# Patient Record
Sex: Female | Born: 1945 | Race: White | Hispanic: No | Smoking: Never smoker
Health system: Southern US, Community
[De-identification: ages and names within clinical notes are randomized; demographics above are authoritative.]

## PROBLEM LIST (undated history)

## (undated) DIAGNOSIS — F419 Anxiety disorder, unspecified: Secondary | ICD-10-CM

## (undated) DIAGNOSIS — J45909 Unspecified asthma, uncomplicated: Secondary | ICD-10-CM

## (undated) DIAGNOSIS — D649 Anemia, unspecified: Secondary | ICD-10-CM

## (undated) DIAGNOSIS — F329 Major depressive disorder, single episode, unspecified: Secondary | ICD-10-CM

## (undated) DIAGNOSIS — I10 Essential (primary) hypertension: Secondary | ICD-10-CM

## (undated) DIAGNOSIS — N189 Chronic kidney disease, unspecified: Secondary | ICD-10-CM

## (undated) DIAGNOSIS — M1990 Unspecified osteoarthritis, unspecified site: Secondary | ICD-10-CM

## (undated) DIAGNOSIS — F32A Depression, unspecified: Secondary | ICD-10-CM

## (undated) DIAGNOSIS — J189 Pneumonia, unspecified organism: Secondary | ICD-10-CM

## (undated) DIAGNOSIS — J302 Other seasonal allergic rhinitis: Secondary | ICD-10-CM

## (undated) DIAGNOSIS — R0600 Dyspnea, unspecified: Secondary | ICD-10-CM

## (undated) DIAGNOSIS — K219 Gastro-esophageal reflux disease without esophagitis: Secondary | ICD-10-CM

## (undated) DIAGNOSIS — B009 Herpesviral infection, unspecified: Secondary | ICD-10-CM

## (undated) DIAGNOSIS — K5900 Constipation, unspecified: Secondary | ICD-10-CM

## (undated) DIAGNOSIS — I82409 Acute embolism and thrombosis of unspecified deep veins of unspecified lower extremity: Secondary | ICD-10-CM

## (undated) DIAGNOSIS — Z87442 Personal history of urinary calculi: Secondary | ICD-10-CM

## (undated) HISTORY — PX: OTHER SURGICAL HISTORY: SHX169

## (undated) HISTORY — PX: TUBAL LIGATION: SHX77

## (undated) HISTORY — PX: COLONOSCOPY: SHX174

---

## 1995-10-17 DIAGNOSIS — I82409 Acute embolism and thrombosis of unspecified deep veins of unspecified lower extremity: Secondary | ICD-10-CM

## 1995-10-17 HISTORY — DX: Acute embolism and thrombosis of unspecified deep veins of unspecified lower extremity: I82.409

## 1999-05-12 ENCOUNTER — Other Ambulatory Visit: Admission: RE | Admit: 1999-05-12 | Discharge: 1999-05-12 | Payer: Self-pay | Admitting: Internal Medicine

## 2000-09-11 ENCOUNTER — Other Ambulatory Visit: Admission: RE | Admit: 2000-09-11 | Discharge: 2000-09-11 | Payer: Self-pay | Admitting: Internal Medicine

## 2011-11-13 ENCOUNTER — Inpatient Hospital Stay: Admit: 2011-11-13 | Payer: Self-pay | Admitting: Orthopedic Surgery

## 2011-11-13 SURGERY — ARTHROPLASTY, KNEE, TOTAL
Anesthesia: Spinal | Laterality: Right

## 2015-05-10 ENCOUNTER — Other Ambulatory Visit: Payer: Self-pay | Admitting: Internal Medicine

## 2015-05-10 DIAGNOSIS — Z1231 Encounter for screening mammogram for malignant neoplasm of breast: Secondary | ICD-10-CM

## 2015-12-02 DIAGNOSIS — M170 Bilateral primary osteoarthritis of knee: Secondary | ICD-10-CM | POA: Diagnosis not present

## 2015-12-02 DIAGNOSIS — M1712 Unilateral primary osteoarthritis, left knee: Secondary | ICD-10-CM | POA: Diagnosis not present

## 2015-12-02 DIAGNOSIS — M1711 Unilateral primary osteoarthritis, right knee: Secondary | ICD-10-CM | POA: Diagnosis not present

## 2016-01-24 DIAGNOSIS — Z01818 Encounter for other preprocedural examination: Secondary | ICD-10-CM | POA: Diagnosis not present

## 2016-01-24 DIAGNOSIS — N183 Chronic kidney disease, stage 3 (moderate): Secondary | ICD-10-CM | POA: Diagnosis not present

## 2016-01-24 DIAGNOSIS — M1712 Unilateral primary osteoarthritis, left knee: Secondary | ICD-10-CM | POA: Diagnosis not present

## 2016-01-26 NOTE — H&P (Signed)
TOTAL KNEE ADMISSION H&P  Patient is being admitted for left total knee arthroplasty.  Subjective:  Chief Complaint:   Left knee primary OA / pain  HPI: Nicole Stephenson, 70 y.o. female, has a history of pain and functional disability in the left knee due to arthritis and has failed non-surgical conservative treatments for greater than 12 weeks to include  NSAID's and/or analgesics, corticosteriod injections, viscosupplementation injections and activity modification.  Onset of symptoms was gradual, starting >10 years ago with gradually worsening course since that time. The patient noted no past surgery on the left knee(s).  Patient currently rates pain in the left knee(s) at 9 out of 10 with activity. Patient has night pain, worsening of pain with activity and weight bearing, pain that interferes with activities of daily living, pain with passive range of motion, crepitus and joint swelling.  Patient has evidence of periarticular osteophytes and joint space narrowing by imaging studies. There is no active infection.  Risks, benefits and expectations were discussed with the patient.  Risks including but not limited to the risk of anesthesia, blood clots, nerve damage, blood vessel damage, failure of the prosthesis, infection and up to and including death.  Patient understand the risks, benefits and expectations and wishes to proceed with surgery.   PCP: Hoyle SauerAVVA,RAVISANKAR R, MD  D/C Plans:      Home with HHPT  Post-op Meds:       No Rx given   Tranexamic Acid:      To be given - topically (previous DVT)  Decadron:      Is to be given  FYI:     Xarelto then ASA  Norco    Past Medical History  Diagnosis Date  . Hypertension   . Pneumonia   . Depression   . GERD (gastroesophageal reflux disease)   . Arthritis   . Anemia     Past Surgical History  Procedure Laterality Date  . Bottom of left foot for keloids - 1993    . Right foot surgery ,rod inserted in lateral side of foot      No  prescriptions prior to admission   Allergies  Allergen Reactions  . Penicillins     Has patient had a PCN reaction causing immediate rash, facial/tongue/throat swelling, SOB or lightheadedness with hypotension: "Arm swelling, turned red and itched really bad" Has patient had a PCN reaction causing severe rash involving mucus membranes or skin necrosis:  Has patient had a PCN reaction that required hospitalization No Has patient had a PCN reaction occurring within the last 10 years: No If all of the above answers are "NO", then may proceed with Cephalosporin use.     Social History  Substance Use Topics  . Smoking status: Never Smoker   . Smokeless tobacco: Never Used  . Alcohol Use: No       Review of Systems  Constitutional: Negative.   HENT: Negative.   Eyes: Negative.   Respiratory: Negative.   Cardiovascular: Negative.   Gastrointestinal: Positive for heartburn.  Genitourinary: Negative.   Musculoskeletal: Positive for joint pain.  Skin: Negative.   Neurological: Negative.   Endo/Heme/Allergies: Negative.   Psychiatric/Behavioral: Positive for depression.    Objective:  Physical Exam  Constitutional: She is oriented to person, place, and time. She appears well-developed.  HENT:  Head: Normocephalic.  Eyes: Pupils are equal, round, and reactive to light.  Neck: Neck supple. No JVD present. No tracheal deviation present. No thyromegaly present.  Cardiovascular: Normal rate, regular rhythm,  normal heart sounds and intact distal pulses.   Respiratory: Effort normal and breath sounds normal. No stridor. No respiratory distress. She has no wheezes.  GI: Soft. There is no tenderness. There is no guarding.  Musculoskeletal:       Left knee: She exhibits decreased range of motion, swelling and bony tenderness. She exhibits no ecchymosis, no deformity, no laceration and no erythema. Tenderness found.  Lymphadenopathy:    She has no cervical adenopathy.  Neurological: She  is alert and oriented to person, place, and time.  Skin: Skin is warm and dry.  Psychiatric: She has a normal mood and affect.      Imaging Review Plain radiographs demonstrate severe degenerative joint disease of the left knee(s). The overall alignment is neutral. The bone quality appears to be good for age and reported activity level.  Assessment/Plan:  End stage arthritis, left knee   The patient history, physical examination, clinical judgment of the provider and imaging studies are consistent with end stage degenerative joint disease of the left knee(s) and total knee arthroplasty is deemed medically necessary. The treatment options including medical management, injection therapy arthroscopy and arthroplasty were discussed at length. The risks and benefits of total knee arthroplasty were presented and reviewed. The risks due to aseptic loosening, infection, stiffness, patella tracking problems, thromboembolic complications and other imponderables were discussed. The patient acknowledged the explanation, agreed to proceed with the plan and consent was signed. Patient is being admitted for inpatient treatment for surgery, pain control, PT, OT, prophylactic antibiotics, VTE prophylaxis, progressive ambulation and ADL's and discharge planning. The patient is planning to be discharged home with home health services.     Anastasio Auerbach Makelle Marrone   PA-C  02/03/2016, 12:00 PM

## 2016-01-31 NOTE — Patient Instructions (Signed)
Nicole Stephenson  01/31/2016   Your procedure is scheduled on: 02-08-16  [facility]  [facility]  [facility]vators to 3rd floor to  Short Stay Center at 7:00 AM.  Call this number if you have problems the morning of surgery [phone]   Remember: ONLY 1 PERSON MAY GO WITH YOU TO SHORT STAY TO GET  READY MORNING OF YOUR SURGERY.  Do not eat food or drink liquids :After Midnight.     Take these medicines the morning of surgery with A SIP OF WATER:  Amlodipine (norvasc), Wellbutrin, Zrytec, Cymbalta, Prilosec DO NOT TAKE ANY DIABETIC MEDICATIONS DAY OF YOUR SURGERY                               You may not have any metal on your body including hair pins and              piercings  Do not wear jewelry, make-up, lotions, powders or perfumes, deodorant             Do not wear nail polish.  Do not shave  48 hours prior to surgery.            Do not bring valuables to the hospital. [facility] IS NOT             RESPONSIBLE   FOR VALUABLES.  Contacts, dentures or bridgework may not be worn into surgery.  Leave suitcase in the car. After surgery it may be brought to your room.       Special Instructions: coughing and deep breathing exercises, leg exercises              Please read over the following fact sheets you were given: _____________________________________________________________________             [facility] Preparing [facility] Before surgery, you can play an important role.  Because skin is not sterile, your skin needs to be as free of germs as possible.  You can reduce the number of germs on your skin by washing with CHG (chlorahexidine gluconate) soap before surgery.  CHG is an antiseptic cleaner which kills germs and bonds with the skin to continue killing germs even after washing. Please DO NOT use if you have an allergy to CHG or antibacterial soaps.  If your skin becomes reddened/irritated stop using the CHG and inform your  nurse when you arrive at Short Stay. Do not shave (including legs and underarms) for at least 48 hours prior to the first CHG shower.  You may shave your face/neck. Please follow these instructions carefully:  1.  Shower with CHG Soap the night before surgery and the  morning of Surgery.  2.  If you choose to wash your hair, wash your hair first as usual with your  normal  shampoo.  3.  After you shampoo, rinse your hair and body thoroughly to remove the  shampoo.                           4.  Use CHG as you would any other liquid soap.  You can apply chg directly  to the skin and wash  Gently with a scrungie or clean washcloth.  5.  Apply the CHG Soap to your body ONLY FROM THE NECK DOWN.   Do not use on face/ open                           Wound or open sores. Avoid contact with eyes, ears mouth and genitals (private parts).                       Wash face,  Genitals (private parts) with your normal soap.             6.  Wash thoroughly, paying special attention to the area where your surgery  will be performed.  7.  Thoroughly rinse your body with warm water from the neck down.  8.  DO NOT shower/wash with your normal soap after using and rinsing off  the CHG Soap.                9.  Pat yourself dry with a clean towel.            10.  Wear clean pajamas.            11.  Place clean sheets on your bed the night of your first shower and do not  sleep with pets. Day of Surgery : Do not apply any lotions/deodorants the morning of surgery.  Please wear clean clothes to the hospital/surgery center.  FAILURE TO FOLLOW THESE INSTRUCTIONS MAY RESULT IN THE CANCELLATION OF YOUR SURGERY PATIENT SIGNATURE_________________________________  NURSE SIGNATURE__________________________________  ________________________________________________________________________   Nicole Stephenson  An incentive spirometer is a tool that can help keep your lungs clear and active. This tool  measures how well you are filling your lungs with each breath. Taking long deep breaths may help reverse or decrease the chance of developing breathing (pulmonary) problems (especially infection) following:  A long period of time when you are unable to move or be active. BEFORE THE PROCEDURE   If the spirometer includes an indicator to show your best effort, your nurse or respiratory therapist will set it to a desired goal.  If possible, sit up straight or lean slightly forward. Try not to slouch.  Hold the incentive spirometer in an upright position. INSTRUCTIONS FOR USE   Sit on the edge of your bed if possible, or sit up as far as you can in bed or on a chair.  Hold the incentive spirometer in an upright position.  Breathe out normally.  Place the mouthpiece in your mouth and seal your lips tightly around it.  Breathe in slowly and as deeply as possible, raising the piston or the ball toward the top of the column.  Hold your breath for 3-5 seconds or for as long as possible. Allow the piston or ball to fall to the bottom of the column.  Remove the mouthpiece from your mouth and breathe out normally.  Rest for a few seconds and repeat Steps 1 through 7 at least 10 times every 1-2 hours when you are awake. Take your time and take a few normal breaths between deep breaths.  The spirometer may include an indicator to show your best effort. Use the indicator as a goal to work toward during each repetition.  After each set of 10 deep breaths, practice coughing to be sure your lungs are clear. If you have an incision (the cut made at the time of surgery),  support your incision when coughing by placing a pillow or rolled up towels firmly against it. Once you are able to get out of bed, walk around indoors and cough well. You may stop using the incentive spirometer when instructed by your caregiver.  RISKS AND COMPLICATIONS  Take your time so you do not get dizzy or light-headed.  If  you are in pain, you may need to take or ask for pain medication before doing incentive spirometry. It is harder to take a deep breath if you are having pain. AFTER USE  Rest and breathe slowly and easily.  It can be helpful to keep track of a log of your progress. Your caregiver can provide you with a simple table to help with this. If you are using the spirometer at home, follow these instructions: [place] IF:   You are having difficultly using the spirometer.  You have trouble using the spirometer as often as instructed.  Your pain medication is not giving enough relief while using the spirometer.  You develop fever of 100.5 F (38.1 C) or higher. SEEK IMMEDIATE MEDICAL CARE IF:   You cough up bloody sputum that had not been present before.  You develop fever of 102 F (38.9 C) or greater.  You develop worsening pain at or near the incision site. MAKE SURE YOU:   Understand these instructions.  Will watch your condition.  Will get help right away if you are not doing well or get worse. Document Released: 02/12/2007 Document Revised: 12/25/2011 Document Reviewed: 04/15/2007 Exit[employer] Patient Information 2014 ExitCare, [place].   ________________________________________________________________________  WHAT IS A BLOOD TRANSFUSION? Blood Transfusion Information  A transfusion is the replacement of blood or some of its parts. Blood is made up of multiple cells which provide different functions.  Red blood cells carry oxygen and are used for blood loss replacement.  White blood cells fight against infection.  Platelets control bleeding.  Plasma helps clot blood.  Other blood products are available for specialized needs, such as hemophilia or other clotting disorders. BEFORE THE TRANSFUSION  Who gives blood for transfusions?   Healthy volunteers who are fully evaluated to make sure their blood is safe. This is blood bank blood. Transfusion therapy is the  safest it has ever been in the practice of medicine. Before blood is taken from a donor, a complete history is taken to make sure that person has no history of diseases nor engages in risky social behavior (examples are intravenous drug use or sexual activity with multiple partners). The donor's travel history is screened to minimize risk of transmitting infections, such as malaria. The donated blood is tested for signs of infectious diseases, such as HIV and hepatitis. The blood is then tested to be sure it is compatible with you in order to minimize the chance of a transfusion reaction. If you or a relative donates blood, this is often done in anticipation of surgery and is not appropriate for emergency situations. It takes many days to process the donated blood. RISKS AND COMPLICATIONS Although transfusion therapy is very safe and saves many lives, the main dangers of transfusion include:   Getting an infectious disease.  Developing a transfusion reaction. This is an allergic reaction to something in the blood you were given. Every precaution is taken to prevent this. The decision to have a blood transfusion has been considered carefully by your caregiver before blood is given. Blood is not given unless the benefits outweigh the risks. AFTER THE TRANSFUSION  Right after receiving a blood transfusion, you will usually feel much better and more energetic. This is especially true if your red blood cells have gotten low (anemic). The transfusion raises the level of the red blood cells which carry oxygen, and this usually causes an energy increase.  The nurse administering the transfusion will monitor you carefully for complications. HOME CARE INSTRUCTIONS  No special instructions are needed after a transfusion. You may find your energy is better. Speak with your caregiver about any limitations on activity for underlying diseases you may have. SEEK MEDICAL CARE IF:   Your condition is not improving  after your transfusion.  You develop redness or irritation at the intravenous (IV) site. SEEK IMMEDIATE MEDICAL CARE IF:  Any of the following symptoms occur over the next 12 hours:  Shaking chills.  You have a temperature by mouth above 102 F (38.9 C), not controlled by medicine.  Chest, back, or muscle pain.  People around you feel you are not acting correctly or are confused.  Shortness of breath or difficulty breathing.  Dizziness and fainting.  You get a rash or develop hives.  You have a decrease in urine output.  Your urine turns a dark color or changes to pink, red, or brown. Any of the following symptoms occur over the next 10 days:  You have a temperature by mouth above 102 F (38.9 C), not controlled by medicine.  Shortness of breath.  Weakness after normal activity.  The white part of the eye turns yellow (jaundice).  You have a decrease in the amount of urine or are urinating less often.  Your urine turns a dark color or changes to pink, red, or brown. Document Released: 09/29/2000 Document Revised: 12/25/2011 Document Reviewed: 05/18/2008 [facility] Patient Information 2014 [place], [place].  _______________________________________________________________________

## 2016-02-01 ENCOUNTER — Encounter (HOSPITAL_COMMUNITY)
Admission: RE | Admit: 2016-02-01 | Discharge: 2016-02-01 | Disposition: A | Discharge status: Home / Self Care | Payer: Medicare Other | Source: Ambulatory Visit | Attending: Orthopedic Surgery | Admitting: Orthopedic Surgery

## 2016-02-01 ENCOUNTER — Encounter (HOSPITAL_COMMUNITY): Payer: Self-pay

## 2016-02-01 DIAGNOSIS — Z01812 Encounter for preprocedural laboratory examination: Secondary | ICD-10-CM | POA: Insufficient documentation

## 2016-02-01 HISTORY — DX: Pneumonia, unspecified organism: J18.9

## 2016-02-01 HISTORY — DX: Depression, unspecified: F32.A

## 2016-02-01 HISTORY — DX: Essential (primary) hypertension: I10

## 2016-02-01 HISTORY — DX: Anemia, unspecified: D64.9

## 2016-02-01 HISTORY — DX: Major depressive disorder, single episode, unspecified: F32.9

## 2016-02-01 HISTORY — DX: Unspecified osteoarthritis, unspecified site: M19.90

## 2016-02-01 HISTORY — DX: Gastro-esophageal reflux disease without esophagitis: K21.9

## 2016-02-01 LAB — BASIC METABOLIC PANEL
ANION GAP: 10 (ref 5–15)
BUN: 16 mg/dL (ref 6–20)
CHLORIDE: 100 mmol/L — AB (ref 101–111)
CO2: 29 mmol/L (ref 22–32)
Calcium: 10 mg/dL (ref 8.9–10.3)
Creatinine, Ser: 1.39 mg/dL — ABNORMAL HIGH (ref 0.44–1.00)
GFR calc Af Amer: 44 mL/min — ABNORMAL LOW (ref >60)
GFR calc non Af Amer: 38 mL/min — ABNORMAL LOW (ref >60)
GLUCOSE: 104 mg/dL — AB (ref 65–99)
POTASSIUM: 4.6 mmol/L (ref 3.5–5.1)
Sodium: 139 mmol/L (ref 135–145)

## 2016-02-01 LAB — URINALYSIS, ROUTINE W REFLEX MICROSCOPIC
Bilirubin Urine: NEGATIVE
GLUCOSE, UA: NEGATIVE mg/dL
Hgb urine dipstick: NEGATIVE
KETONES UR: NEGATIVE mg/dL
NITRITE: NEGATIVE
PROTEIN: NEGATIVE mg/dL
Specific Gravity, Urine: 1.013 (ref 1.005–1.030)
pH: 6.5 (ref 5.0–8.0)

## 2016-02-01 LAB — CBC
HEMATOCRIT: 35.9 % — AB (ref 36.0–46.0)
HEMOGLOBIN: 12.2 g/dL (ref 12.0–15.0)
MCH: 31.3 pg (ref 26.0–34.0)
MCHC: 34 g/dL (ref 30.0–36.0)
MCV: 92.1 fL (ref 78.0–100.0)
Platelets: 264 10*3/uL (ref 150–400)
RBC: 3.9 MIL/uL (ref 3.87–5.11)
RDW: 13.3 % (ref 11.5–15.5)
WBC: 5.8 10*3/uL (ref 4.0–10.5)

## 2016-02-01 LAB — PROTIME-INR
INR: 1.07 (ref 0.00–1.49)
Prothrombin Time: 13.6 seconds (ref 11.6–15.2)

## 2016-02-01 LAB — ABO/RH: ABO/RH(D): A POS

## 2016-02-01 LAB — URINE MICROSCOPIC-ADD ON: Bacteria, UA: NONE SEEN

## 2016-02-01 LAB — APTT: aPTT: 23 seconds — ABNORMAL LOW (ref 24–37)

## 2016-02-01 LAB — SURGICAL PCR SCREEN
MRSA, PCR: NEGATIVE
Staphylococcus aureus: POSITIVE — AB

## 2016-02-01 NOTE — Progress Notes (Signed)
02-01-16 - PT, UA/MICRO labs from preop visit on 02-01-16 fax[facility]

## 2016-02-01 NOTE — Progress Notes (Signed)
04-30-15 - EKG - in chart

## 2016-02-08 ENCOUNTER — Inpatient Hospital Stay (HOSPITAL_COMMUNITY): Payer: Medicare Other | Admitting: Anesthesiology

## 2016-02-08 ENCOUNTER — Inpatient Hospital Stay (HOSPITAL_COMMUNITY)
Admission: RE | Admit: 2016-02-08 | Discharge: 2016-02-09 | DRG: 470 | Disposition: A | Discharge status: Home Health | Payer: Medicare Other | Source: Ambulatory Visit | Attending: Orthopedic Surgery | Admitting: Orthopedic Surgery

## 2016-02-08 ENCOUNTER — Encounter (HOSPITAL_COMMUNITY): Payer: Self-pay

## 2016-02-08 ENCOUNTER — Encounter (HOSPITAL_COMMUNITY)
Admission: RE | Disposition: A | Discharge status: Home Health | Payer: Self-pay | Source: Ambulatory Visit | Attending: Orthopedic Surgery

## 2016-02-08 DIAGNOSIS — Z6830 Body mass index (BMI) 30.0-30.9, adult: Secondary | ICD-10-CM | POA: Diagnosis not present

## 2016-02-08 DIAGNOSIS — M179 Osteoarthritis of knee, unspecified: Secondary | ICD-10-CM | POA: Diagnosis not present

## 2016-02-08 DIAGNOSIS — Z96659 Presence of unspecified artificial knee joint: Secondary | ICD-10-CM

## 2016-02-08 DIAGNOSIS — I10 Essential (primary) hypertension: Secondary | ICD-10-CM | POA: Diagnosis present

## 2016-02-08 DIAGNOSIS — M1712 Unilateral primary osteoarthritis, left knee: Principal | ICD-10-CM | POA: Diagnosis present

## 2016-02-08 DIAGNOSIS — E669 Obesity, unspecified: Secondary | ICD-10-CM | POA: Diagnosis not present

## 2016-02-08 DIAGNOSIS — M25562 Pain in left knee: Secondary | ICD-10-CM | POA: Diagnosis present

## 2016-02-08 DIAGNOSIS — Z86718 Personal history of other venous thrombosis and embolism: Secondary | ICD-10-CM | POA: Diagnosis not present

## 2016-02-08 DIAGNOSIS — M659 Synovitis and tenosynovitis, unspecified: Secondary | ICD-10-CM | POA: Diagnosis present

## 2016-02-08 DIAGNOSIS — K219 Gastro-esophageal reflux disease without esophagitis: Secondary | ICD-10-CM | POA: Diagnosis not present

## 2016-02-08 DIAGNOSIS — Z96652 Presence of left artificial knee joint: Secondary | ICD-10-CM

## 2016-02-08 HISTORY — PX: TOTAL KNEE ARTHROPLASTY: SHX125

## 2016-02-08 LAB — TYPE AND SCREEN
ABO/RH(D): A POS
Antibody Screen: NEGATIVE

## 2016-02-08 SURGERY — ARTHROPLASTY, KNEE, TOTAL
Anesthesia: Spinal | Site: Knee | Laterality: Left

## 2016-02-08 MED ORDER — RIVAROXABAN 10 MG PO TABS
10.0000 mg | ORAL_TABLET | ORAL | Status: DC
Start: 2016-02-09 — End: 2016-02-09
  Administered 2016-02-09: 10 mg via ORAL
  Filled 2016-02-08 (×2): qty 1

## 2016-02-08 MED ORDER — DOCUSATE SODIUM 100 MG PO CAPS
100.0000 mg | ORAL_CAPSULE | Freq: Two times a day (BID) | ORAL | Status: DC
  Administered 2016-02-08 – 2016-02-09 (×2): 100 mg via ORAL

## 2016-02-08 MED ORDER — 0.9 % SODIUM CHLORIDE (POUR BTL) OPTIME
TOPICAL | Status: DC | PRN
  Administered 2016-02-08: 1000 mL

## 2016-02-08 MED ORDER — BUPIVACAINE-EPINEPHRINE (PF) 0.25% -1:200000 IJ SOLN
INTRAMUSCULAR | Status: AC
  Filled 2016-02-08: qty 30

## 2016-02-08 MED ORDER — DEXAMETHASONE SODIUM PHOSPHATE 10 MG/ML IJ SOLN
10.0000 mg | Freq: Once | INTRAMUSCULAR | Status: AC
  Administered 2016-02-08: 10 mg via INTRAVENOUS

## 2016-02-08 MED ORDER — ONDANSETRON HCL 4 MG/2ML IJ SOLN: 4.0000 mg | Freq: Four times a day (QID) | INTRAMUSCULAR | Status: DC | PRN

## 2016-02-08 MED ORDER — BISACODYL 10 MG RE SUPP
10.0000 mg | Freq: Every day | RECTAL | Status: DC | PRN
Start: 2016-02-08 — End: 2016-02-09

## 2016-02-08 MED ORDER — HYDROMORPHONE HCL 1 MG/ML IJ SOLN
0.5000 mg | INTRAMUSCULAR | Status: DC | PRN
  Administered 2016-02-08: 0.5 mg via INTRAVENOUS
  Filled 2016-02-08: qty 1

## 2016-02-08 MED ORDER — BUPIVACAINE-EPINEPHRINE (PF) 0.25% -1:200000 IJ SOLN
INTRAMUSCULAR | Status: DC | PRN
  Administered 2016-02-08: 30 mL

## 2016-02-08 MED ORDER — OMEPRAZOLE MAGNESIUM 20 MG PO TBEC
40.0000 mg | DELAYED_RELEASE_TABLET | Freq: Every day | ORAL | Status: DC
  Administered 2016-02-09: 40 mg via ORAL
  Filled 2016-02-08 (×2): qty 2

## 2016-02-08 MED ORDER — BUPIVACAINE IN DEXTROSE 0.75-8.25 % IT SOLN
INTRATHECAL | Status: DC | PRN
  Administered 2016-02-08: 1.8 mL via INTRATHECAL

## 2016-02-08 MED ORDER — BUPROPION HCL ER (SR) 150 MG PO TB12
150.0000 mg | ORAL_TABLET | Freq: Every morning | ORAL | Status: DC
  Administered 2016-02-09: 150 mg via ORAL
  Filled 2016-02-08: qty 1

## 2016-02-08 MED ORDER — MIDAZOLAM HCL 5 MG/5ML IJ SOLN
INTRAMUSCULAR | Status: DC | PRN
  Administered 2016-02-08: 2 mg via INTRAVENOUS

## 2016-02-08 MED ORDER — SODIUM CHLORIDE 0.9 % IV SOLN
2000.0000 mg | Freq: Once | INTRAVENOUS | Status: DC
  Filled 2016-02-08: qty 20

## 2016-02-08 MED ORDER — LACTATED RINGERS IV SOLN: INTRAVENOUS | Status: DC

## 2016-02-08 MED ORDER — VANCOMYCIN HCL IN DEXTROSE 1-5 GM/200ML-% IV SOLN
1000.0000 mg | Freq: Two times a day (BID) | INTRAVENOUS | Status: AC
  Administered 2016-02-08: 1000 mg via INTRAVENOUS
  Filled 2016-02-08: qty 200

## 2016-02-08 MED ORDER — KETOROLAC TROMETHAMINE 30 MG/ML IJ SOLN
INTRAMUSCULAR | Status: DC | PRN
  Administered 2016-02-08: 30 mg

## 2016-02-08 MED ORDER — STERILE WATER FOR IRRIGATION IR SOLN
Status: DC | PRN
  Administered 2016-02-08: 2000 mL

## 2016-02-08 MED ORDER — HYDROCODONE-ACETAMINOPHEN 7.5-325 MG PO TABS
1.0000 | ORAL_TABLET | ORAL | Status: DC
  Administered 2016-02-08 – 2016-02-09 (×6): 2 via ORAL
  Administered 2016-02-09: 1 via ORAL
  Filled 2016-02-08 (×7): qty 2

## 2016-02-08 MED ORDER — MENTHOL 3 MG MT LOZG: 1.0000 | LOZENGE | OROMUCOSAL | Status: DC | PRN

## 2016-02-08 MED ORDER — MEPERIDINE HCL 50 MG/ML IJ SOLN: 6.2500 mg | INTRAMUSCULAR | Status: DC | PRN

## 2016-02-08 MED ORDER — DULOXETINE HCL 30 MG PO CPEP
30.0000 mg | ORAL_CAPSULE | Freq: Every day | ORAL | Status: DC
  Administered 2016-02-09: 30 mg via ORAL
  Filled 2016-02-08: qty 1

## 2016-02-08 MED ORDER — FERROUS SULFATE 325 (65 FE) MG PO TABS
325.0000 mg | ORAL_TABLET | Freq: Three times a day (TID) | ORAL | Status: DC
  Administered 2016-02-08 – 2016-02-09 (×3): 325 mg via ORAL
  Filled 2016-02-08 (×5): qty 1

## 2016-02-08 MED ORDER — DEXTROSE 5 % IV SOLN
10.0000 mg | INTRAVENOUS | Status: DC | PRN
  Administered 2016-02-08: 50 ug/min via INTRAVENOUS

## 2016-02-08 MED ORDER — SODIUM CHLORIDE 0.9 % IJ SOLN
INTRAMUSCULAR | Status: DC | PRN
  Administered 2016-02-08: 30 mL

## 2016-02-08 MED ORDER — POLYETHYLENE GLYCOL 3350 17 G PO PACK
17.0000 g | PACK | Freq: Two times a day (BID) | ORAL | Status: DC
  Administered 2016-02-08 – 2016-02-09 (×2): 17 g via ORAL

## 2016-02-08 MED ORDER — ONDANSETRON HCL 4 MG/2ML IJ SOLN
INTRAMUSCULAR | Status: AC
  Filled 2016-02-08: qty 2

## 2016-02-08 MED ORDER — SODIUM CHLORIDE 0.9 % IR SOLN
Status: DC | PRN
  Administered 2016-02-08: 1000 mL

## 2016-02-08 MED ORDER — PROPOFOL 500 MG/50ML IV EMUL
INTRAVENOUS | Status: DC | PRN
Start: 2016-02-08 — End: 2016-02-08
  Administered 2016-02-08: 100 ug/kg/min via INTRAVENOUS

## 2016-02-08 MED ORDER — PHENYLEPHRINE HCL 10 MG/ML IJ SOLN
INTRAMUSCULAR | Status: AC
  Filled 2016-02-08: qty 1

## 2016-02-08 MED ORDER — ONDANSETRON HCL 4 MG PO TABS: 4.0000 mg | ORAL_TABLET | Freq: Four times a day (QID) | ORAL | Status: DC | PRN

## 2016-02-08 MED ORDER — DEXAMETHASONE SODIUM PHOSPHATE 10 MG/ML IJ SOLN
INTRAMUSCULAR | Status: AC
  Filled 2016-02-08: qty 1

## 2016-02-08 MED ORDER — CELECOXIB 200 MG PO CAPS
200.0000 mg | ORAL_CAPSULE | Freq: Two times a day (BID) | ORAL | Status: DC
  Administered 2016-02-09: 200 mg via ORAL
  Filled 2016-02-08 (×3): qty 1

## 2016-02-08 MED ORDER — DEXAMETHASONE SODIUM PHOSPHATE 10 MG/ML IJ SOLN
10.0000 mg | Freq: Once | INTRAMUSCULAR | Status: AC
  Administered 2016-02-09: 10 mg via INTRAVENOUS
  Filled 2016-02-08: qty 1

## 2016-02-08 MED ORDER — SODIUM CHLORIDE 0.9 % IV SOLN
INTRAVENOUS | Status: DC
  Administered 2016-02-08: 14:00:00 via INTRAVENOUS

## 2016-02-08 MED ORDER — AMLODIPINE BESYLATE 10 MG PO TABS
10.0000 mg | ORAL_TABLET | Freq: Every day | ORAL | Status: DC
  Administered 2016-02-09: 10 mg via ORAL
  Filled 2016-02-08: qty 1

## 2016-02-08 MED ORDER — ALUM & MAG HYDROXIDE-SIMETH 200-200-20 MG/5ML PO SUSP: 30.0000 mL | ORAL | Status: DC | PRN

## 2016-02-08 MED ORDER — FENTANYL CITRATE (PF) 100 MCG/2ML IJ SOLN
INTRAMUSCULAR | Status: AC
  Filled 2016-02-08: qty 2

## 2016-02-08 MED ORDER — METOCLOPRAMIDE HCL 5 MG/ML IJ SOLN
5.0000 mg | Freq: Three times a day (TID) | INTRAMUSCULAR | Status: DC | PRN
Start: 2016-02-08 — End: 2016-02-09

## 2016-02-08 MED ORDER — PHENOL 1.4 % MT LIQD: 1.0000 | OROMUCOSAL | Status: DC | PRN

## 2016-02-08 MED ORDER — FENTANYL CITRATE (PF) 100 MCG/2ML IJ SOLN
INTRAMUSCULAR | Status: DC | PRN
  Administered 2016-02-08: 100 ug via INTRAVENOUS

## 2016-02-08 MED ORDER — PROPOFOL 10 MG/ML IV BOLUS
INTRAVENOUS | Status: AC
  Filled 2016-02-08: qty 20

## 2016-02-08 MED ORDER — SODIUM CHLORIDE 0.9 % IJ SOLN
INTRAMUSCULAR | Status: AC
  Filled 2016-02-08: qty 50

## 2016-02-08 MED ORDER — CHLORHEXIDINE GLUCONATE 4 % EX LIQD: 60.0000 mL | Freq: Once | CUTANEOUS | Status: DC

## 2016-02-08 MED ORDER — SODIUM CHLORIDE 0.9 % IV SOLN
2000.0000 mg | INTRAVENOUS | Status: DC | PRN
  Administered 2016-02-08: 2000 mg via TOPICAL

## 2016-02-08 MED ORDER — HYDROCHLOROTHIAZIDE 25 MG PO TABS
25.0000 mg | ORAL_TABLET | Freq: Every morning | ORAL | Status: DC
  Administered 2016-02-08 – 2016-02-09 (×2): 25 mg via ORAL
  Filled 2016-02-08 (×2): qty 1

## 2016-02-08 MED ORDER — CEFAZOLIN SODIUM 1 G IJ SOLR: 2.0000 g | INTRAMUSCULAR | Status: DC

## 2016-02-08 MED ORDER — METOCLOPRAMIDE HCL 10 MG PO TABS: 5.0000 mg | ORAL_TABLET | Freq: Three times a day (TID) | ORAL | Status: DC | PRN

## 2016-02-08 MED ORDER — ONDANSETRON HCL 4 MG/2ML IJ SOLN
INTRAMUSCULAR | Status: DC | PRN
  Administered 2016-02-08: 4 mg via INTRAVENOUS

## 2016-02-08 MED ORDER — METHOCARBAMOL 1000 MG/10ML IJ SOLN
500.0000 mg | Freq: Four times a day (QID) | INTRAVENOUS | Status: DC | PRN
  Administered 2016-02-08: 500 mg via INTRAVENOUS
  Filled 2016-02-08 (×2): qty 5

## 2016-02-08 MED ORDER — PROPOFOL 10 MG/ML IV BOLUS
INTRAVENOUS | Status: AC
  Filled 2016-02-08: qty 60

## 2016-02-08 MED ORDER — KETOROLAC TROMETHAMINE 30 MG/ML IJ SOLN
INTRAMUSCULAR | Status: AC
  Filled 2016-02-08: qty 1

## 2016-02-08 MED ORDER — LORATADINE 10 MG PO TABS
10.0000 mg | ORAL_TABLET | Freq: Every day | ORAL | Status: DC
  Administered 2016-02-09: 10 mg via ORAL
  Filled 2016-02-08: qty 1

## 2016-02-08 MED ORDER — MAGNESIUM CITRATE PO SOLN: 1.0000 | Freq: Once | ORAL | Status: DC | PRN

## 2016-02-08 MED ORDER — HYDROMORPHONE HCL 1 MG/ML IJ SOLN: 0.2500 mg | INTRAMUSCULAR | Status: DC | PRN

## 2016-02-08 MED ORDER — MIDAZOLAM HCL 2 MG/2ML IJ SOLN
INTRAMUSCULAR | Status: AC
  Filled 2016-02-08: qty 2

## 2016-02-08 MED ORDER — LACTATED RINGERS IV SOLN
INTRAVENOUS | Status: DC
  Administered 2016-02-08: 1000 mL via INTRAVENOUS

## 2016-02-08 MED ORDER — DIPHENHYDRAMINE HCL 25 MG PO CAPS: 25.0000 mg | ORAL_CAPSULE | Freq: Four times a day (QID) | ORAL | Status: DC | PRN

## 2016-02-08 MED ORDER — VANCOMYCIN HCL IN DEXTROSE 1-5 GM/200ML-% IV SOLN
1000.0000 mg | Freq: Once | INTRAVENOUS | Status: AC
  Administered 2016-02-08: 1000 mg via INTRAVENOUS
  Filled 2016-02-08: qty 200

## 2016-02-08 MED ORDER — PRAVASTATIN SODIUM 40 MG PO TABS
40.0000 mg | ORAL_TABLET | Freq: Every morning | ORAL | Status: DC
  Administered 2016-02-08 – 2016-02-09 (×2): 40 mg via ORAL
  Filled 2016-02-08 (×2): qty 1

## 2016-02-08 MED ORDER — METHOCARBAMOL 500 MG PO TABS
500.0000 mg | ORAL_TABLET | Freq: Four times a day (QID) | ORAL | Status: DC | PRN
  Administered 2016-02-09: 500 mg via ORAL
  Filled 2016-02-08: qty 1

## 2016-02-08 MED ORDER — TRAZODONE HCL 50 MG PO TABS: 50.0000 mg | ORAL_TABLET | Freq: Every evening | ORAL | Status: DC | PRN

## 2016-02-08 MED ORDER — LACTATED RINGERS IV SOLN
INTRAVENOUS | Status: DC
  Administered 2016-02-08 (×3): via INTRAVENOUS

## 2016-02-08 SURGICAL SUPPLY — 52 items
BAG DECANTER FOR FLEXI CONT (MISCELLANEOUS) IMPLANT
BAG ZIPLOCK 12X15 (MISCELLANEOUS) IMPLANT
BANDAGE ACE 6X5 VEL STRL LF (GAUZE/BANDAGES/DRESSINGS) ×2 IMPLANT
BLADE SAW SGTL 13.0X1.19X90.0M (BLADE) ×2 IMPLANT
BOWL SMART MIX CTS (DISPOSABLE) ×2 IMPLANT
CAPT KNEE TOTAL 3 ATTUNE ×2 IMPLANT
CEMENT HV SMART SET (Cement) ×4 IMPLANT
CLOTH BEACON ORANGE TIMEOUT ST (SAFETY) ×2 IMPLANT
CUFF TOURN SGL QUICK 34 (TOURNIQUET CUFF) ×1
CUFF TRNQT CYL 34X4X40X1 (TOURNIQUET CUFF) ×1 IMPLANT
DECANTER SPIKE VIAL GLASS SM (MISCELLANEOUS) ×2 IMPLANT
DRAPE U-SHAPE 47X51 STRL (DRAPES) ×2 IMPLANT
DRESSING AQUACEL AG SP 3.5X10 (GAUZE/BANDAGES/DRESSINGS) IMPLANT
DRSG AQUACEL AG ADV 3.5X10 (GAUZE/BANDAGES/DRESSINGS) ×2 IMPLANT
DRSG AQUACEL AG SP 3.5X10 (GAUZE/BANDAGES/DRESSINGS)
DURAPREP 26ML APPLICATOR (WOUND CARE) ×4 IMPLANT
ELECT REM PT RETURN 9FT ADLT (ELECTROSURGICAL) ×2
ELECTRODE REM PT RTRN 9FT ADLT (ELECTROSURGICAL) ×1 IMPLANT
GLOVE BIO SURGEON STRL SZ7 (GLOVE) ×2 IMPLANT
GLOVE BIOGEL PI IND STRL 7.0 (GLOVE) ×1 IMPLANT
GLOVE BIOGEL PI IND STRL 7.5 (GLOVE) ×3 IMPLANT
GLOVE BIOGEL PI IND STRL 8 (GLOVE) ×1 IMPLANT
GLOVE BIOGEL PI IND STRL 8.5 (GLOVE) ×1 IMPLANT
GLOVE BIOGEL PI INDICATOR 7.0 (GLOVE) ×1
GLOVE BIOGEL PI INDICATOR 7.5 (GLOVE) ×3
GLOVE BIOGEL PI INDICATOR 8 (GLOVE) ×1
GLOVE BIOGEL PI INDICATOR 8.5 (GLOVE) ×1
GLOVE ECLIPSE 8.0 STRL XLNG CF (GLOVE) ×2 IMPLANT
GLOVE ORTHO TXT STRL SZ7.5 (GLOVE) ×4 IMPLANT
GLOVE SURG SS PI 7.5 STRL IVOR (GLOVE) ×2 IMPLANT
GLOVE SURG SS PI 8.0 STRL IVOR (GLOVE) ×2 IMPLANT
GOWN STRL REUS W/ TWL LRG LVL3 (GOWN DISPOSABLE) ×1 IMPLANT
GOWN STRL REUS W/TWL LRG LVL3 (GOWN DISPOSABLE) ×5 IMPLANT
GOWN STRL REUS W/TWL XL LVL3 (GOWN DISPOSABLE) ×4 IMPLANT
HANDPIECE INTERPULSE COAX TIP (DISPOSABLE) ×1
LIQUID BAND (GAUZE/BANDAGES/DRESSINGS) ×2 IMPLANT
MANIFOLD NEPTUNE II (INSTRUMENTS) ×2 IMPLANT
PACK TOTAL KNEE CUSTOM (KITS) ×2 IMPLANT
POSITIONER SURGICAL ARM (MISCELLANEOUS) ×2 IMPLANT
SET HNDPC FAN SPRY TIP SCT (DISPOSABLE) ×1 IMPLANT
SET PAD KNEE POSITIONER (MISCELLANEOUS) ×2 IMPLANT
SUCTION FRAZIER HANDLE 12FR (TUBING) ×1
SUCTION TUBE FRAZIER 12FR DISP (TUBING) ×1 IMPLANT
SUT MNCRL AB 4-0 PS2 18 (SUTURE) ×2 IMPLANT
SUT VIC AB 1 CT1 36 (SUTURE) ×6 IMPLANT
SUT VIC AB 2-0 CT1 27 (SUTURE) ×3
SUT VIC AB 2-0 CT1 TAPERPNT 27 (SUTURE) ×3 IMPLANT
SUT VLOC 180 0 24IN GS25 (SUTURE) ×2 IMPLANT
SYR 50ML LL SCALE MARK (SYRINGE) ×2 IMPLANT
TRAY FOLEY W/METER SILVER 14FR (SET/KITS/TRAYS/PACK) ×2 IMPLANT
WRAP KNEE MAXI GEL POST OP (GAUZE/BANDAGES/DRESSINGS) ×2 IMPLANT
YANKAUER SUCT BULB TIP 10FT TU (MISCELLANEOUS) ×2 IMPLANT

## 2016-02-08 NOTE — Interval H&P Note (Signed)
History and Physical Interval Note:  02/08/2016 9:01 AM  Nicole Stephenson  has presented today for surgery, with the diagnosis of LEFT KNEE OSTEOARTHRITIS  The various methods of treatment have been discussed with the patient and family. After consideration of risks, benefits and other options for treatment, the patient has consented to  Procedure(s): LEFT TOTAL KNEE ARTHROPLASTY (Left) as a surgical intervention .  The patient's history has been reviewed, patient examined, no change in status, stable for surgery.  I have reviewed the patient's chart and labs.  Questions were answered to the patient's satisfaction.     Shelda PalLIN,[name] D

## 2016-02-08 NOTE — Evaluation (Signed)
Physical Therapy Evaluation Patient Details Name: Nicole Stephenson MRN: [id number] DOB: 1946/03/26 Today's Date: 02/08/2016   History of Present Illness  s/p L TKA  Clinical Impression  Pt is s/p TKA resulting in the deficits listed below (see PT Problem List).  Pt will benefit from skilled PT to increase their independence and safety with mobility to allow discharge to the venue listed below.      Follow Up Recommendations Home health PT    Equipment Recommendations  Rolling walker with 5" wheels    Recommendations for Other Services       Precautions / Restrictions Precautions Precautions: Fall Restrictions Other Position/Activity Restrictions: WBAT      Mobility  Bed Mobility Overal bed mobility: Needs Assistance Bed Mobility: Supine to Sit     Supine to sit: Min assist     General bed mobility comments: cues for technique, assist with LLE  Transfers Overall transfer level: Needs assistance Equipment used: Rolling walker (2 wheeled) Transfers: Sit to/from Stand Sit to Stand: Min assist         General transfer comment: cues for hand placement and LLE position  Ambulation/Gait Ambulation/Gait assistance: Min assist Ambulation Distance (Feet): 11 Feet Assistive device: Rolling walker (2 wheeled) Gait Pattern/deviations: Step-to pattern;Antalgic     General Gait Details: cues for sequence, posture, RW distance from self  Stairs            Wheelchair Mobility    Modified Rankin (Stroke Patients Only)       Balance                                             Pertinent Vitals/Pain Pain Assessment: 0-10 Pain Score: 3  Pain Location: L knee Pain Descriptors / Indicators: Sore Pain Intervention(s): Limited activity within patient's tolerance;Monitored during session;Premedicated before session;Repositioned;Ice applied    Home Living Family/patient expects to be discharged to:: Private residence Living Arrangements: Other  relatives (18, 70 yo grand-dtrs)   Type of Home: House Home Access: Stairs to enter Entrance Stairs-Rails: Right;Left;Can reach both Secretary/administrator of Steps: 5 Home Layout: One level Home Equipment: Bedside commode Additional Comments: has shower seat that is too big for shower    Prior Function Level of Independence: Independent               Hand Dominance        Extremity/Trunk Assessment   Upper Extremity Assessment: Defer to OT evaluation           Lower Extremity Assessment: LLE deficits/detail   LLE Deficits / Details: ankle WFL, knee extension and hip flexion 2+/5;      Communication   Communication: No difficulties  Cognition Arousal/Alertness: Awake/alert Behavior During Therapy: WFL for tasks assessed/performed Overall Cognitive Status: Within Functional Limits for tasks assessed                      General Comments      Exercises Total Joint Exercises Ankle Circles/Pumps: AROM;Both;10 reps Quad Sets: 10 reps;Both;AROM      Assessment/Plan    PT Assessment Patient needs continued PT services  PT Diagnosis Difficulty walking   PT Problem List Decreased strength;Decreased range of motion;Decreased activity tolerance;Decreased balance;Decreased mobility;Decreased knowledge of use of DME;Decreased knowledge of precautions;Pain  PT Treatment Interventions DME instruction;Gait training;Functional mobility training;Therapeutic activities;Therapeutic exercise;Patient/family education;Stair training   PT Goals (Current  goals can be found in the Care Plan section) Acute Rehab PT Goals Patient Stated Goal: less pain with amb PT Goal Formulation: With patient Time For Goal Achievement: 02/11/16 Potential to Achieve Goals: Good    Frequency 7X/week   Barriers to discharge        Co-evaluation               End of Session Equipment Utilized During Treatment: Gait belt Activity Tolerance: Patient tolerated treatment  well Patient left: in chair;with call bell/phone within reach;with chair alarm set Nurse Communication: Mobility status         Time: [id number] PT Time Calculation (min) (ACUTE ONLY): 21 min   Charges:   PT Evaluation $PT Eval Low Complexity: 1 Procedure     PT G Codes:        Machell Wirthlin 02/08/2016, 5:03 PM

## 2016-02-08 NOTE — Anesthesia Postprocedure Evaluation (Signed)
Anesthesia Post Note  Patient: Nicole SloopBrenda Hoeppner  Procedure(s) Performed: Procedure(s) (LRB): LEFT TOTAL KNEE ARTHROPLASTY (Left)  Patient location during evaluation: PACU Anesthesia Type: Spinal Level of consciousness: oriented and awake and alert Pain management: pain level controlled Vital Signs Assessment: post-procedure vital signs reviewed and stable Respiratory status: spontaneous breathing, respiratory function stable and patient connected to nasal cannula oxygen Cardiovascular status: blood pressure returned to baseline and stable Postop Assessment: no headache, no backache and spinal receding Anesthetic complications: no    Last Vitals:  Filed Vitals:   02/08/16 1230 02/08/16 1245  BP: 124/89   Pulse: 71 75  Temp:    Resp: 12 16    Last Pain:  Filed Vitals:   02/08/16 1252  PainSc: 3                  Shelton SilvasKevin D Hamish Banks

## 2016-02-08 NOTE — Anesthesia Procedure Notes (Signed)
Spinal Patient location during procedure: OR Start time: 02/08/2016 10:00 AM End time: 02/08/2016 10:06 AM Staffing Anesthesiologist: Suella Broad D Performed by: anesthesiologist  Preanesthetic Checklist Completed: patient identified, site marked, surgical consent, pre-op evaluation, timeout performed, IV checked, risks and benefits discussed and monitors and equipment checked Spinal Block Patient position: sitting Prep: Betadine Patient monitoring: heart rate, continuous pulse ox, blood pressure and cardiac monitor Approach: midline Location: L4-5 Injection technique: single-shot Needle Needle type: Introducer and Sprotte  Needle gauge: 24 G Needle length: 9 cm Additional Notes Negative paresthesia. Negative blood return. Positive free-flowing CSF. Expiration date of kit checked and confirmed. Patient tolerated procedure well, without complications.

## 2016-02-08 NOTE — Transfer of Care (Signed)
Immediate Anesthesia Transfer of Care Note  Patient: Nicole SloopBrenda Townley  Procedure(s) Performed: Procedure(s): LEFT TOTAL KNEE ARTHROPLASTY (Left)  Patient Location: PACU  Anesthesia Type:Spinal  Level of Consciousness: awake, alert , oriented and patient cooperative  Airway & Oxygen Therapy: Patient Spontanous Breathing and Patient connected to face mask oxygen  Post-op Assessment: Report given to RN, Post -op Vital signs reviewed and stable and Patient moving all extremities X 4  Post vital signs: stable  Last Vitals:  Filed Vitals:   02/08/16 0710  BP: 115/85  Pulse: 88  Temp: 36.8 C  Resp: 18    Complications: No apparent anesthesia complications  S2 spinal level

## 2016-02-08 NOTE — Op Note (Signed)
NAME:  Nicole Stephenson                      MEDICAL RECORD NO.:  [id number]                             FACILITY[facility]  [facility]      PHYSICIAN:  Madlyn FrankelMatthew D. Charlann Boxerlin, M.D.  DATE OF BIRTH:  09/10/1946      DATE OF PROCEDURE:  02/08/2016                                     OPERATIVE REPORT         PREOPERATIVE DIAGNOSIS:  Left knee osteoarthritis.      POSTOPERATIVE DIAGNOSIS:  Left knee osteoarthritis.      FINDINGS:  The patient was noted to have complete loss of cartilage and   bone-on-bone arthritis with associated osteophytes in all three compartments of   the knee but far more significant in the patellofemoral compartment as will be noted in body of report with a significant synovitis and associated effusion.      PROCEDURE:  Left total knee replacement.      COMPONENTS USED:  DePuy Attune rotating platform posterior stabilized knee   system, a size 5N femur, 4 tibia, size 6 mm PS AOX insert, and 35 anatomic patellar   button.      SURGEON:  Madlyn FrankelMatthew D. Charlann Boxerlin, M.D.      ASSISTANT:  Lanney GinsMatthew Babish, PA-C.      ANESTHESIA:  Spinal.      SPECIMENS:  None.      COMPLICATION:  None.      DRAINS:  None  EBL: <100cc      TOURNIQUET TIME:   Total Tourniquet Time Documented: Thigh (Left) - 41 minutes Total: Thigh (Left) - 41 minutes  .      The patient was stable to the recovery room.      INDICATION FOR PROCEDURE:  Nicole Stephenson is a 70 y.o. female patient of   mine.  The patient had been seen, evaluated, and treated conservatively in the   office with medication, activity modification, and injections.  The patient had   radiographic changes of bone-on-bone arthritis with endplate sclerosis and osteophytes noted.      The patient failed conservative measures including medication, injections, and activity modification, and at this point was ready for more definitive measures.   Based on the radiographic changes and failed conservative measures, the patient   decided to proceed with  total knee replacement.  Risks of infection,   DVT, component failure, need for revision surgery, postop course, and   expectations were all   discussed and reviewed.  Consent was obtained for benefit of pain   relief.      PROCEDURE IN DETAIL:  The patient was brought to the operative theater.   Once adequate anesthesia, preoperative antibiotics, 1 gm of Vancomycin and 10 mg of Decadron (2 gm of Tranexamic Acid applied as topical form at time of capsule closure) administered, the patient was positioned supine with the left thigh tourniquet placed.  The  left lower extremity was prepped and draped in sterile fashion.  A time-   out was performed identifying the patient, planned procedure, and   extremity.      The left lower extremity [facility]  [facility].  The leg was   exsanguinated, tourniquet elevated to 250 mmHg.  A midline incision was   made followed by median parapatellar arthrotomy.  Following initial   exposure, attention was first directed to the patella.  Her patella was noted to be severely abnormal.  Pre-operatively I had noted how thin it was but also recognized mid line vertical fracture line, chronically.  Upon exposure this was indeed the case.  I elected to excise smaller fragments leaving me with a single patella fragment about 1.5- 2 cm in medial lateral dimension and only about 14 mm thick.  I made a very thin cut on the undersurface then drilled for a button. I ultimately elected to use the 35 button for tracking purposes and to try and restore some normalcy to this compartment.      The lug holes were drilled and a metal shim was placed to protect the   patella from retractors and saw blades.      At this point, attention was now directed to the femur.  The femoral   canal was opened with a drill, irrigated to try to prevent fat emboli.  An   intramedullary rod was passed at 3 degrees valgus, 9 mm of bone was   resected off the distal femur.  Following  this resection, the tibia was   subluxated anteriorly.  Using the extramedullary guide, 4 mm of bone was resected off   the proximal medial tibia.  We confirmed the gap would be   stable medially and laterally with a size 5 mm insert as well as confirmed   the cut was perpendicular in the coronal plane, checking with an alignment rod.      Once this was done, I sized the femur to be a size 5 in the anterior-   posterior dimension, chose a narrow component based on medial and   lateral dimension.  The size 5 rotation block was then pinned in   position anterior referenced using the C-clamp to set rotation.  The   anterior, posterior, and  chamfer cuts were made without difficulty nor   notching making certain that I was along the anterior cortex to help   with flexion gap stability.      The final box cut was made off the lateral aspect of distal femur.      At this point, the tibia was sized to be a size 4, the size 4 tray was   then pinned in position through the medial third of the tubercle,   drilled, and keel punched.  Trial reduction was now carried with a 5 femur,  4 tibia, a size 5 then 6 mm PS insert, and the 35 patella botton.  The knee was brought to   extension, full extension with good flexion stability with the patella   tracking through the trochlea without application of pressure.  Given   all these findings the femoral lug holes were drilled and then the trial components removed.  Final components were   opened and cement was mixed.  The knee was irrigated with normal saline   solution and pulse lavage.  The synovial lining was   then injected with 30 cc 0.25% Marcaine with epinephrine and 1 cc of Toradol plus 30 cc of NS for a  total of 61 cc.      The knee was irrigated.  Final implants were then cemented onto clean and   dried cut surfaces of bone with the  knee brought to extension with a size 6 mm trial insert.      Once the cement had fully cured, the excess cement  was removed   throughout the knee.  I confirmed I was satisfied with the range of   motion and stability, and the final size 6 mm PS AOX insert was chosen.  It was   placed into the knee.      The tourniquet had been let down at 41 minutes.  No significant   hemostasis required.  The   extensor mechanism was then reapproximated using #1 Vicryl and #0 V-lock sutures with the knee   in flexion.  The   remaining wound was closed with 2-0 Vicryl and running 4-0 Monocryl.   The knee was cleaned, dried, dressed sterilely using Dermabond and   Aquacel dressing.  The patient was then   brought to recovery room in stable condition, tolerating the procedure   well.   Please note that Physician Assistant, Lanney Gins, PA-C, was present for the entirety of the case, and was utilized for pre-operative positioning, peri-operative retractor management, general facilitation of the procedure.  He was also utilized for primary wound closure at the end of the case.              Madlyn Frankel Charlann Boxer, M.D.    02/08/2016 7:38 PM

## 2016-02-08 NOTE — Anesthesia Preprocedure Evaluation (Addendum)
Anesthesia Evaluation  Patient identified by MRN, date of birth, ID band Patient awake    Reviewed: Allergy & Precautions, NPO status , Patient's Chart, lab work & pertinent test results  Airway Mallampati: III  TM Distance: >3 FB Neck ROM: Full    Dental  (+) Teeth Intact   Pulmonary neg pulmonary ROS,    breath sounds clear to auscultation       Cardiovascular hypertension,  Rhythm:Regular Rate:Normal     Neuro/Psych PSYCHIATRIC DISORDERS Depression negative neurological ROS     GI/Hepatic Neg liver ROS, GERD  Medicated,  Endo/Other  negative endocrine ROS  Renal/GU negative Renal ROS  negative genitourinary   Musculoskeletal  (+) Arthritis ,   Abdominal   Peds negative pediatric ROS (+)  Hematology   Anesthesia Other Findings   Reproductive/Obstetrics negative OB ROS                            Lab Results  Component Value Date   WBC 5.8 02/01/2016   HGB 12.2 02/01/2016   HCT 35.9* 02/01/2016   MCV 92.1 02/01/2016   PLT 264 02/01/2016   Lab Results  Component Value Date   INR 1.07 02/01/2016     Anesthesia Physical Anesthesia Plan  ASA: II  Anesthesia Plan: Spinal   Post-op Pain Management:    Induction: Intravenous  Airway Management Planned: Natural Airway and Simple Face Mask  Additional Equipment:   Intra-op Plan:   Post-operative Plan:   Informed Consent: I have reviewed the patients History and Physical, chart, labs and discussed the procedure including the risks, benefits and alternatives for the proposed anesthesia with the patient or authorized representative who has indicated his/her understanding and acceptance.   Dental advisory given  Plan Discussed with: CRNA  Anesthesia Plan Comments:         Anesthesia Quick Evaluation

## 2016-02-09 ENCOUNTER — Encounter (HOSPITAL_COMMUNITY): Payer: Self-pay | Admitting: Orthopedic Surgery

## 2016-02-09 DIAGNOSIS — E669 Obesity, unspecified: Secondary | ICD-10-CM | POA: Diagnosis present

## 2016-02-09 LAB — BASIC METABOLIC PANEL
ANION GAP: 9 (ref 5–15)
BUN: 23 mg/dL — ABNORMAL HIGH (ref 6–20)
CHLORIDE: 99 mmol/L — AB (ref 101–111)
CO2: 22 mmol/L (ref 22–32)
Calcium: 8.8 mg/dL — ABNORMAL LOW (ref 8.9–10.3)
Creatinine, Ser: 1.49 mg/dL — ABNORMAL HIGH (ref 0.44–1.00)
GFR calc Af Amer: 40 mL/min — ABNORMAL LOW (ref >60)
GFR, EST NON AFRICAN AMERICAN: 35 mL/min — AB (ref >60)
GLUCOSE: 136 mg/dL — AB (ref 65–99)
POTASSIUM: 3.9 mmol/L (ref 3.5–5.1)
Sodium: 130 mmol/L — ABNORMAL LOW (ref 135–145)

## 2016-02-09 LAB — CBC
HEMATOCRIT: 28.1 % — AB (ref 36.0–46.0)
Hemoglobin: 9.5 g/dL — ABNORMAL LOW (ref 12.0–15.0)
MCH: 31.7 pg (ref 26.0–34.0)
MCHC: 33.8 g/dL (ref 30.0–36.0)
MCV: 93.7 fL (ref 78.0–100.0)
PLATELETS: 211 10*3/uL (ref 150–400)
RBC: 3 MIL/uL — ABNORMAL LOW (ref 3.87–5.11)
RDW: 13.4 % (ref 11.5–15.5)
WBC: 8.1 10*3/uL (ref 4.0–10.5)

## 2016-02-09 MED ORDER — DOCUSATE SODIUM 100 MG PO CAPS: 100.0000 mg | ORAL_CAPSULE | Freq: Two times a day (BID) | ORAL | Status: DC

## 2016-02-09 MED ORDER — HYDROCODONE-ACETAMINOPHEN 7.5-325 MG PO TABS: 1.0000 | ORAL_TABLET | ORAL | Status: DC | PRN

## 2016-02-09 MED ORDER — POLYETHYLENE GLYCOL 3350 17 G PO PACK: 17.0000 g | PACK | Freq: Two times a day (BID) | ORAL | Status: DC

## 2016-02-09 MED ORDER — FERROUS SULFATE 325 (65 FE) MG PO TABS: 325.0000 mg | ORAL_TABLET | Freq: Three times a day (TID) | ORAL | Status: DC

## 2016-02-09 MED ORDER — TIZANIDINE HCL 4 MG PO TABS: 4.0000 mg | ORAL_TABLET | Freq: Four times a day (QID) | ORAL | Status: DC | PRN

## 2016-02-09 MED ORDER — CELECOXIB 200 MG PO CAPS: 200.0000 mg | ORAL_CAPSULE | Freq: Two times a day (BID) | ORAL | Status: DC

## 2016-02-09 MED ORDER — RIVAROXABAN 10 MG PO TABS: 10.0000 mg | ORAL_TABLET | ORAL | Status: DC

## 2016-02-09 NOTE — Progress Notes (Signed)
Physical Therapy Treatment Patient Details Name: Nicole SloopBrenda Stephenson MRN: [id number] DOB: 06/04/1946 Today's Date: 02/09/2016    History of Present Illness s/p L TKA    PT Comments    Pt progressing although limited by pain this am; will need to see again in pm for stair training  Follow Up Recommendations  Home health PT     Equipment Recommendations  Rolling walker with 5" wheels    Recommendations for Other Services       Precautions / Restrictions Precautions Precautions: Fall Restrictions Weight Bearing Restrictions: No Other Position/Activity Restrictions: WBAT    Mobility  Bed Mobility Overal bed mobility: Needs Assistance Bed Mobility: Sit to Supine       Sit to supine: Min guard   General bed mobility comments: min/guard with L LE onto bed, incr time  Transfers Overall transfer level: Needs assistance Equipment used: Rolling walker (2 wheeled) Transfers: Sit to/from Stand Sit to Stand: Supervision         General transfer comment: for safety  Ambulation/Gait Ambulation/Gait assistance: Supervision;Min guard Ambulation Distance (Feet): 30 Feet Assistive device: Rolling walker (2 wheeled) Gait Pattern/deviations: Step-to pattern;Antalgic;Trunk flexed     General Gait Details: cues for sequence, posture, RW distance from self   Stairs            Wheelchair Mobility    Modified Rankin (Stroke Patients Only)       Balance                                    Cognition Arousal/Alertness: Awake/alert Behavior During Therapy: WFL for tasks assessed/performed Overall Cognitive Status: Within Functional Limits for tasks assessed                      Exercises Total Joint Exercises Ankle Circles/Pumps: AROM;Both;10 reps Quad Sets: 10 reps;Both;AROM Short Arc Quad: AROM;AAROM;Left;10 reps Heel Slides: AAROM;Left;10 reps Straight Leg Raises: AROM;10 reps;Left    General Comments        Pertinent Vitals/Pain  Pain Score: 7  Pain Location: L knee with amb Pain Descriptors / Indicators: Sore Pain Intervention(s): Limited activity within patient's tolerance;Monitored during session;Premedicated before session;Ice applied    Home Living                      Prior Function            PT Goals (current goals can now be found in the care plan section) Acute Rehab PT Goals Patient Stated Goal: less pain with amb PT Goal Formulation: With patient Time For Goal Achievement: 02/11/16 Potential to Achieve Goals: Good Progress towards PT goals: Progressing toward goals    Frequency  7X/week    PT Plan Current plan remains appropriate    Co-evaluation             End of Session Equipment Utilized During Treatment: Gait belt Activity Tolerance: Patient limited by pain Patient left: in bed;with call bell/phone within reach;with bed alarm set     Time: 1030-1053 PT Time Calculation (min) (ACUTE ONLY): 23 min  Charges:  $Gait Training: 8-22 mins $Therapeutic Exercise: 8-22 mins                    G Codes:      [place] 02/09/2016, 12:35 PM

## 2016-02-09 NOTE — Progress Notes (Signed)
Pt to d/c home with [facility] home [facility]. DME (RW) delivered to room prior to d/c. AVS reviewed and "My Chart" discussed with pt. Pt capable of verbalizing medications, signs and symptoms of infection, and follow-up appointments. Remains hemodynamically stable. No signs and symptoms of distress. Educated pt to return to ER in the case of SOB, dizziness, or chest pain.  

## 2016-02-09 NOTE — Care Management Note (Signed)
Case Management Note  Patient Details  Name: Nicole Stephenson MRN: [id number] Date of Birth: [birth year]  Subjective/Objective:                  LEFT TOTAL KNEE ARTHROPLASTY (Left)  Action/Plan: Discharge planning Expected Discharge Date:  02/09/16              Expected Discharge Plan:  Council  In-House Referral:     Discharge planning Services  CM Consult  Post Acute Care Choice:  Home Health Choice offered to:  Patient  DME Arranged:  Walker rolling DME Agency:  [place]:  PT [facility] Agency:  [place]  Status of Service:  Completed, signed off  Medicare Important Message Given:    Date Medicare IM Given:    Medicare IM give by:    Date Additional Medicare IM Given:    Additional Medicare Important Message give by:     If discussed at [place] of Stay Meetings, dates discussed:    Additional Comments: CM met with pt in room to offer choice of home health agency.  Pt chooses [facility] to render HHPT.  Referral called to [employer], Tim.  CM called [facility] DME rep, Lecretia to please deliver the rolling walker to room prior to discharge.  No other CM needs were communicated. Dellie Catholic, RN 02/09/2016, 12:34 PM

## 2016-02-09 NOTE — Evaluation (Addendum)
Occupational Therapy Evaluation Patient Details Name: Nicole Stephenson MRN: [id number] DOB: 01-15-1946 Today's Date: 02/09/2016    History of Present Illness s/p L TKA   Clinical Impression   This 70 year old female was admitted for the above surgery.  All education was completed.  No further OT is needed at this time    Follow Up Recommendations  No OT follow up    Equipment Recommendations  None recommended by OT    Recommendations for Other Services       Precautions / Restrictions Precautions Precautions: Fall Restrictions Weight Bearing Restrictions: No      Mobility Bed Mobility         Supine to sit: Supervision     General bed mobility comments: pt used arms to assist leg off of bed  Transfers   Equipment used: Rolling walker (2 wheeled)   Sit to Stand: Supervision         General transfer comment: for safety    Balance                                            ADL Overall ADL's : Needs assistance/impaired     Grooming: Supervision/safety;Standing       Lower Body Bathing: Minimal assistance;Sit to/from stand       Lower Body Dressing: Minimal assistance;Sit to/from stand   Toilet Transfer: Min guard;Ambulation;BSC;RW   Toileting- Clothing Manipulation and Hygiene: Supervision/safety;Sit to/from stand         General ADL Comments: reviewed shower transfer and placement of 3:1 (with either high seat or rigging shower curtain and supporting foot on stool outside, due to small shower. Pt did not want to practice shower as she didn't want pain to increase.  Verbalizes sequence and handout given. Granddaughters are working out 24/7 assistance.  Pt has been using a RW prior to sx and demonstrates good safety.  Pt is not interested in AE--has assistance as needed     Vision     Perception     Praxis      Pertinent Vitals/Pain Pain Score: 5  Pain Location: L knee Pain Descriptors / Indicators: Aching Pain  Intervention(s): Limited activity within patient's tolerance;Monitored during session;Premedicated before session;Repositioned;Ice applied     Hand Dominance     Extremity/Trunk Assessment Upper Extremity Assessment Upper Extremity Assessment:  (bil shoulders limited to approx 80)           Communication Communication Communication: No difficulties   Cognition Arousal/Alertness: Awake/alert Behavior During Therapy: WFL for tasks assessed/performed Overall Cognitive Status: Within Functional Limits for tasks assessed                     General Comments       Exercises       Shoulder Instructions      Home Living Family/patient expects to be discharged to:: Private residence Living Arrangements: Other (Comment) Available Help at Discharge: Family               Bathroom Shower/Tub: Walk-in Soil scientist Toilet: Handicapped height     Home Equipment: Bedside commode   Additional Comments: has shower seat that is too big for shower      Prior Functioning/Environment Level of Independence: Independent             OT Diagnosis: Acute pain   OT Problem  List:     OT Treatment/Interventions:      OT Goals(Current goals can be found in the care plan section) Acute Rehab OT Goals Patient Stated Goal: less pain with amb OT Goal Formulation: All assessment and education complete, DC therapy  OT Frequency:     Barriers to D/C:            Co-evaluation              End of Session    Activity Tolerance: Patient tolerated treatment well Patient left: in chair;with call bell/phone within reach;with chair alarm set   Time: [id number] OT Time Calculation (min): 30 min Charges:  OT General Charges $OT Visit: 1 Procedure OT Evaluation $OT Eval Low Complexity: 1 Procedure OT Treatments $Self Care/Home Management : 8-22 mins G-Codes:    Luvern Mcisaac 02/09/2016, 9:38 AM Marica OtterMaryellen Panagiotis Oelkers, OTR/L [phone] 02/09/2016

## 2016-02-09 NOTE — Discharge Instructions (Addendum)
INSTRUCTIONS AFTER JOINT REPLACEMENT  ° °o Remove items at home which could result in a fall. This includes throw rugs or furniture in walking pathways °o ICE to the affected joint every three hours while awake for 30 minutes at a time, for at least the first 3-5 days, and then as needed for pain and swelling.  Continue to use ice for pain and swelling. You may notice swelling that will progress down to the foot and ankle.  This is normal after surgery.  Elevate your leg when you are not up walking on it.   °o Continue to use the breathing machine you got in the hospital (incentive spirometer) which will help keep your temperature down.  It is common for your temperature to cycle up and down following surgery, especially at night when you are not up moving around and exerting yourself.  The breathing machine keeps your lungs expanded and your temperature down. ° ° °DIET:  As you were doing prior to hospitalization, we recommend a well-balanced diet. ° °DRESSING / WOUND CARE / SHOWERING ° °Keep the surgical dressing until follow up.  The dressing is water proof, so you can shower without any extra covering.  IF THE DRESSING FALLS OFF or the wound gets wet inside, change the dressing with sterile gauze.  Please use good hand washing techniques before changing the dressing.  Do not use any lotions or creams on the incision until instructed by your surgeon.   ° °ACTIVITY ° °o Increase activity slowly as tolerated, but follow the weight bearing instructions below.   °o No driving for 6 weeks or until further direction given by your physician.  You cannot drive while taking narcotics.  °o No lifting or carrying greater than 10 lbs. until further directed by your surgeon. °o Avoid periods of inactivity such as sitting longer than an hour when not asleep. This helps prevent blood clots.  °o You may return to work once you are authorized by your doctor.  ° ° ° °WEIGHT BEARING  ° °Weight bearing as tolerated with assist  device (walker, cane, etc) as directed, use it as long as suggested by your surgeon or therapist, typically at least 4-6 weeks. ° ° °EXERCISES ° °Results after joint replacement surgery are often greatly improved when you follow the exercise, range of motion and muscle strengthening exercises prescribed by your doctor. Safety measures are also important to protect the joint from further injury. Any time any of these exercises cause you to have increased pain or swelling, decrease what you are doing until you are comfortable again and then slowly increase them. If you have problems or questions, call your caregiver or physical therapist for advice.  ° °Rehabilitation is important following a joint replacement. After just a few days of immobilization, the muscles of the leg can become weakened and shrink (atrophy).  These exercises are designed to build up the tone and strength of the thigh and leg muscles and to improve motion. Often times heat used for twenty to thirty minutes before working out will loosen up your tissues and help with improving the range of motion but do not use heat for the first two weeks following surgery (sometimes heat can increase post-operative swelling).  ° °These exercises can be done on a training (exercise) mat, on the floor, on a table or on a bed. Use whatever works the best and is most comfortable for you.    Use music or television while you are exercising so that   the exercises are a pleasant break in your day. This will make your life better with the exercises acting as a break in your routine that you can look forward to.   Perform all exercises about fifteen times, three times per day or as directed.  You should exercise both the operative leg and the other leg as well. ° °Exercises include: °  °• Quad Sets - Tighten up the muscle on the front of the thigh (Quad) and hold for 5-10 seconds.   °• Straight Leg Raises - With your knee straight (if you were given a brace, keep it on),  lift the leg to 60 degrees, hold for 3 seconds, and slowly lower the leg.  Perform this exercise against resistance later as your leg gets stronger.  °• Leg Slides: Lying on your back, slowly slide your foot toward your buttocks, bending your knee up off the floor (only go as far as is comfortable). Then slowly slide your foot back down until your leg is flat on the floor again.  °• Angel Wings: Lying on your back spread your legs to the side as far apart as you can without causing discomfort.  °• Hamstring Strength:  Lying on your back, push your heel against the floor with your leg straight by tightening up the muscles of your buttocks.  Repeat, but this time bend your knee to a comfortable angle, and push your heel against the floor.  You may put a pillow under the heel to make it more comfortable if necessary.  ° °A rehabilitation program following joint replacement surgery can speed recovery and prevent re-injury in the future due to weakened muscles. Contact your doctor or a physical therapist for more information on knee rehabilitation.  ° ° °CONSTIPATION ° °Constipation is defined medically as fewer than three stools per week and severe constipation as less than one stool per week.  Even if you have a regular bowel pattern at home, your normal regimen is likely to be disrupted due to multiple reasons following surgery.  Combination of anesthesia, postoperative narcotics, change in appetite and fluid intake all can affect your bowels.  ° °YOU MUST use at least one of the following options; they are listed in order of increasing strength to get the job done.  They are all available over the counter, and you may need to use some, POSSIBLY even all of these options:   ° °Drink plenty of fluids (prune juice may be helpful) and high fiber foods °Colace 100 mg by mouth twice a day  °Senokot for constipation as directed and as needed Dulcolax (bisacodyl), take with full glass of water  °Miralax (polyethylene glycol)  once or twice a day as needed. ° °If you have tried all these things and are unable to have a bowel movement in the first 3-4 days after surgery call either your surgeon or your primary doctor.   ° °If you experience loose stools or diarrhea, hold the medications until you stool forms back up.  If your symptoms do not get better within 1 week or if they get worse, check with your doctor.  If you experience "the worst abdominal pain ever" or develop nausea or vomiting, please contact the office immediately for further recommendations for treatment. ° ° °ITCHING:  If you experience itching with your medications, try taking only a single pain pill, or even half a pain pill at a time.  You can also use Benadryl over the counter for itching or also to   help with sleep.  ° °TED HOSE STOCKINGS:  Use stockings on both legs until for at least 2 weeks or as directed by physician office. They may be removed at night for sleeping. ° °MEDICATIONS:  See your medication summary on the “After Visit Summary” that nursing will review with you.  You may have some home medications which will be placed on hold until you complete the course of blood thinner medication.  It is important for you to complete the blood thinner medication as prescribed. ° °PRECAUTIONS:  If you experience chest pain or shortness of breath - call 911 immediately for transfer to the hospital emergency department.  ° °If you develop a fever greater that 101 F, purulent drainage from wound, increased redness or drainage from wound, foul odor from the wound/dressing, or calf pain - CONTACT YOUR SURGEON.   °                                                °FOLLOW-UP APPOINTMENTS:  If you do not already have a post-op appointment, please call the office for an appointment to be seen by your surgeon.  Guidelines for how soon to be seen are listed in your “After Visit Summary”, but are typically between 1-4 weeks after surgery. ° °OTHER INSTRUCTIONS:  ° °Knee  Replacement:  Do not place pillow under knee, focus on keeping the knee straight while resting.  ° °MAKE SURE YOU:  °• Understand these instructions.  °• Get help right away if you are not doing well or get worse.  ° ° °Thank you for letting us be a part of your medical care team.  It is a privilege we respect greatly.  We hope these instructions will help you stay on track for a fast and full recovery!  °  °Information on my medicine - XARELTO® (Rivaroxaban) ° °This medication education was reviewed with me or my healthcare representative as part of my discharge preparation.  The pharmacist that spoke with me during my hospital stay was:  Raigen Jagielski L, RPH ° °Why was Xarelto® prescribed for you? °Xarelto® was prescribed for you to reduce the risk of blood clots forming after orthopedic surgery. The medical term for these abnormal blood clots is venous thromboembolism (VTE). ° °What do you need to know about xarelto® ? °Take your Xarelto® ONCE DAILY at the same time every day. °You may take it either with or without food. ° °If you have difficulty swallowing the tablet whole, you may crush it and mix in applesauce just prior to taking your dose. ° °Take Xarelto® exactly as prescribed by your doctor and DO NOT stop taking Xarelto® without talking to the doctor who prescribed the medication.  Stopping without other VTE prevention medication to take the place of Xarelto® may increase your risk of developing a clot. ° °After discharge, you should have regular check-up appointments with your healthcare provider that is prescribing your Xarelto®.   ° °What do you do if you miss a dose? °If you miss a dose, take it as soon as you remember on the same day then continue your regularly scheduled once daily regimen the next day. Do not take two doses of Xarelto® on the same day.  ° °Important Safety Information °A possible side effect of Xarelto® is bleeding. You should call your healthcare provider right away if you    experience any of the following: °? Bleeding from an injury or your nose that does not stop. °? Unusual colored urine (red or dark brown) or unusual colored stools (red or black). °? Unusual bruising for unknown reasons. °? A serious fall or if you hit your head (even if there is no bleeding). ° °Some medicines may interact with Xarelto® and might increase your risk of bleeding while on Xarelto®. To help avoid this, consult your healthcare provider or pharmacist prior to using any new prescription or non-prescription medications, including herbals, vitamins, non-steroidal anti-inflammatory drugs (NSAIDs) and supplements. ° °This website has more information on Xarelto®: www.xarelto.com. ° ° ° °

## 2016-02-09 NOTE — Progress Notes (Signed)
     Subjective: 1 Day Post-Op Procedure(s) (LRB): LEFT TOTAL KNEE ARTHROPLASTY (Left)   Seen by Dr. Charlann Boxerlin. Patient reports pain as mild, pain controlled. No events throughout the night. Ready to be discharged home if she does well with PT.  Objective:   VITALS:   Filed Vitals:   02/09/16 0201 02/09/16 0640  BP: 127/63 123/63  Pulse: 68 64  Temp: 97.8 F (36.6 C) 97.9 F (36.6 C)  Resp: 16 16    Dorsiflexion/Plantar flexion intact Incision: dressing C/D/I No cellulitis present Compartment soft  LABS  Recent Labs  02/09/16 0417  HGB 9.5*  HCT 28.1*  WBC 8.1  PLT 211     Recent Labs  02/09/16 0417  NA 130*  K 3.9  BUN 23*  CREATININE 1.49*  GLUCOSE 136*     Assessment/Plan: 1 Day Post-Op Procedure(s) (LRB): LEFT TOTAL KNEE ARTHROPLASTY (Left) Foley cath d/c'ed Advance diet Up with therapy D/C IV fluids Discharge home with home health  [facility]. Follow up with OLIN,Regino Fournet D in 2 weeks.  Contact information:  [facility] [address], [address] [place] [id number] [phone]    Obese (BMI 30-39.9) Estimated body mass index is 30.29 kg/(m^2) as calculated from the following:   Height as of this encounter: 5\' 5"  (1.651 m).   Weight as of this encounter: 82.555 kg (182 lb). Patient also counseled that weight may inhibit the healing process Patient counseled that losing weight will help with future health issues        Anastasio AuerbachMatthew S. Stokes Rattigan   PAC  02/09/2016, 8:38 AM

## 2016-02-09 NOTE — Progress Notes (Signed)
   02/09/16 1500  PT Visit Information  Last PT Received On 02/09/16  Assistance Needed +1  History of Present Illness s/p L TKA  Subjective Data  Patient Stated Goal less pain with amb  Precautions  Precautions Fall  Restrictions  Weight Bearing Restrictions No  Other Position/Activity Restrictions WBAT  Pain Assessment  Pain Assessment 0-10  Pain Score 5  Pain Location L knee  Pain Descriptors / Indicators Sore  Pain Intervention(s) Limited activity within patient's tolerance;Monitored during session;Premedicated before session;Ice applied  Cognition  Arousal/Alertness Awake/alert  Behavior During Therapy WFL for tasks assessed/performed  Overall Cognitive Status Within Functional Limits for tasks assessed  Bed Mobility  Bed Mobility Supine to Sit;Sit to Supine  Supine to sit Supervision  Sit to supine Supervision (instructed in sheet loop to self assist)  General bed mobility comments cues for use of sheet as leg lifter  Transfers  Overall transfer level Needs assistance  Equipment used Rolling walker (2 wheeled)  Transfers Sit to/from Stand  Sit to Stand Supervision  General transfer comment for safety  Ambulation/Gait  Ambulation/Gait assistance Min guard;Supervision  Ambulation Distance (Feet) 40 Feet  Assistive device Rolling walker (2 wheeled)  Gait Pattern/deviations Step-to pattern;Step-through pattern;Antalgic  General Gait Details cues for sequence, posture, RW distance from self  Stairs Yes  Stairs assistance Min guard  Stair Management Two rails;Step to pattern;Forwards  Number of Stairs 3  General stair comments cues for sequence  Total Joint Exercises  Heel Slides AAROM;Left;10 reps  Straight Leg Raises AROM;10 reps;Left  PT - End of Session  Equipment Utilized During Treatment Gait belt  Activity Tolerance Patient tolerated treatment well  Patient left in bed;with call bell/phone within reach;with bed alarm set  PT - Assessment/Plan  PT Plan  Current plan remains appropriate  PT Frequency (ACUTE ONLY) 7X/week  Follow Up Recommendations Home health PT  PT equipment Rolling walker with 5" wheels  PT Goal Progression  Progress towards PT goals Progressing toward goals  Acute Rehab PT Goals  Time For Goal Achievement 02/11/16  Potential to Achieve Goals Good  PT Time Calculation  PT Start Time (ACUTE ONLY) 1430  PT Stop Time (ACUTE ONLY) 1458  PT Time Calculation (min) (ACUTE ONLY) 28 min  PT General Charges  $$ ACUTE PT VISIT 1 Procedure  PT Treatments  $Gait Training 23-37 mins

## 2016-02-09 NOTE — Progress Notes (Signed)
[facility]    [facility] providing the following services: RW  If patient discharges after hours, please call [phone].   Renard HamperLecretia Williamson 02/09/2016, 11:31 AM

## 2016-02-10 DIAGNOSIS — Z471 Aftercare following joint replacement surgery: Secondary | ICD-10-CM | POA: Diagnosis not present

## 2016-02-10 DIAGNOSIS — F329 Major depressive disorder, single episode, unspecified: Secondary | ICD-10-CM | POA: Diagnosis not present

## 2016-02-10 DIAGNOSIS — E669 Obesity, unspecified: Secondary | ICD-10-CM | POA: Diagnosis not present

## 2016-02-10 DIAGNOSIS — M1991 Primary osteoarthritis, unspecified site: Secondary | ICD-10-CM | POA: Diagnosis not present

## 2016-02-10 DIAGNOSIS — I10 Essential (primary) hypertension: Secondary | ICD-10-CM | POA: Diagnosis not present

## 2016-02-10 DIAGNOSIS — Z96652 Presence of left artificial knee joint: Secondary | ICD-10-CM | POA: Diagnosis not present

## 2016-02-14 NOTE — Discharge Summary (Signed)
Physician Discharge Summary  Patient ID: Nicole Stephenson MRN: [id number] DOB/AGE: 70-Jun-1947 70 y.o.  Admit date: 02/08/2016 Discharge date: 02/09/2016   Procedures:  Procedure(s) (LRB): LEFT TOTAL KNEE ARTHROPLASTY (Left)  Attending Physician:  Dr. Durene Romans   Admission Diagnoses:   Left knee primary OA / pain  Discharge Diagnoses:  Principal Problem:   S/P left TKA Active Problems:   Obese  Past Medical History  Diagnosis Date  . Hypertension   . Pneumonia   . Depression   . GERD (gastroesophageal reflux disease)   . Arthritis   . Anemia     HPI:    Nicole Stephenson, 70 y.o. female, has a history of pain and functional disability in the left knee due to arthritis and has failed non-surgical conservative treatments for greater than 12 weeks to include NSAID's and/or analgesics, corticosteriod injections, viscosupplementation injections and activity modification. Onset of symptoms was gradual, starting >10 years ago with gradually worsening course since that time. The patient noted no past surgery on the left knee(s). Patient currently rates pain in the left knee(s) at 9 out of 10 with activity. Patient has night pain, worsening of pain with activity and weight bearing, pain that interferes with activities of daily living, pain with passive range of motion, crepitus and joint swelling. Patient has evidence of periarticular osteophytes and joint space narrowing by imaging studies. There is no active infection. Risks, benefits and expectations were discussed with the patient. Risks including but not limited to the risk of anesthesia, blood clots, nerve damage, blood vessel damage, failure of the prosthesis, infection and up to and including death. Patient understand the risks, benefits and expectations and wishes to proceed with surgery.   PCP: Hoyle Sauer, MD   Discharged Condition: good  Hospital Course:  Patient underwent the above stated procedure on 02/08/2016.  Patient tolerated the procedure well and brought to the recovery room in good condition and subsequently to the floor.  POD #1 BP: 123/63 ; Pulse: 64 ; Temp: 97.9 F (36.6 C) ; Resp: 16 Patient reports pain as mild, pain controlled. No events throughout the night. Ready to be discharged home. Dorsiflexion/plantar flexion intact, incision: dressing C/D/I, no cellulitis present and compartment soft.   LABS  Basename    HGB     9.5  HCT     28.1    Discharge Exam: General appearance: alert, cooperative and no distress Extremities: Homans sign is negative, no sign of DVT, no edema, redness or tenderness in the calves or thighs and no ulcers, gangrene or trophic changes  Disposition: Home with follow up in 2 weeks   Follow-up Information    Follow up with Shelda Pal, MD. Schedule an appointment as soon as possible for a visit in 2 weeks.   Specialty:  Orthopedic Surgery   Contact information:   [address] [place] [address] [phone]       Follow up with [facility].   Why:  home health physical therapy   Contact information:   [address] [place] [address] [phone]       Follow up with [facility].   Why:  rolling walker   Contact information:   [address] [place] [address] [phone]       Discharge Instructions    Call MD / Call 911    Complete by:  As directed   If you experience chest pain or shortness of breath,  CALL 911 and be transported to the hospital emergency room.  If you develope a fever above 101 F, pus (white drainage) or increased drainage or redness at the wound, or calf pain, call your surgeon's office.     Change dressing    Complete by:  As directed   Maintain surgical dressing until follow up in the clinic. If the edges start to pull up, may reinforce with tape. If the dressing is no longer working, may remove and cover with gauze and tape, but must keep  the area dry and clean.  Call with any questions or concerns.     Constipation Prevention    Complete by:  As directed   Drink plenty of fluids.  Prune juice may be helpful.  You may use a stool softener, such as Colace (over the counter) 100 mg twice a day.  Use MiraLax (over the counter) for constipation as needed.     Diet - low sodium heart healthy    Complete by:  As directed      Discharge instructions    Complete by:  As directed   Maintain surgical dressing until follow up in the clinic. If the edges start to pull up, may reinforce with tape. If the dressing is no longer working, may remove and cover with gauze and tape, but must keep the area dry and clean.  Follow up in 2 weeks at [facility]. Call with any questions or concerns.     Increase activity slowly as tolerated    Complete by:  As directed   Weight bearing as tolerated with assist device (walker, cane, etc) as directed, use it as long as suggested by your surgeon or therapist, typically at least 4-6 weeks.     TED hose    Complete by:  As directed   Use stockings (TED hose) for 2 weeks on both leg(s).  You may remove them at night for sleeping.             Medication List    STOP taking these medications        traMADol 50 MG tablet  Commonly known as:  ULTRAM      TAKE these medications        amLODipine 10 MG tablet  Commonly known as:  NORVASC  Take 10 mg by mouth daily.     buPROPion 150 MG 12 hr tablet  Commonly known as:  WELLBUTRIN SR  Take 150 mg by mouth every morning.     celecoxib 200 MG capsule  Commonly known as:  CELEBREX  Take 1 capsule (200 mg total) by mouth every 12 (twelve) hours.     cetirizine 10 MG tablet  Commonly known as:  ZYRTEC  Take 10 mg by mouth every morning.     docusate sodium 100 MG capsule  Commonly known as:  COLACE  Take 1 capsule (100 mg total) by mouth 2 (two) times daily.     DULoxetine 30 MG capsule  Commonly known as:  CYMBALTA  Take 30 mg  by mouth daily.     ferrous sulfate 325 (65 FE) MG tablet  Take 1 tablet (325 mg total) by mouth 3 (three) times daily after meals.     hydrochlorothiazide 25 MG tablet  Commonly known as:  HYDRODIURIL  Take 25 mg by mouth every morning.     HYDROcodone-acetaminophen 7.5-325 MG tablet  Commonly known as:  NORCO  Take 1-2 tablets by mouth every 4 (four) hours as  needed for moderate pain.     lisinopril 20 MG tablet  Commonly known as:  PRINIVIL,ZESTRIL  Take 20 mg by mouth 2 (two) times daily.     omeprazole 20 MG tablet  Commonly known as:  PRILOSEC OTC  Take 40 mg by mouth every morning.     polyethylene glycol packet  Commonly known as:  MIRALAX / GLYCOLAX  Take 17 g by mouth 2 (two) times daily.     pravastatin 40 MG tablet  Commonly known as:  PRAVACHOL  Take 40 mg by mouth every morning.     rivaroxaban 10 MG Tabs tablet  Commonly known as:  XARELTO  Take 1 tablet (10 mg total) by mouth daily.     tiZANidine 4 MG tablet  Commonly known as:  ZANAFLEX  Take 1 tablet (4 mg total) by mouth every 6 (six) hours as needed for muscle spasms.     traZODone 50 MG tablet  Commonly known as:  DESYREL  Take 50 mg by mouth at bedtime as needed. Sleep.         Signed: Anastasio AuerbachMatthew S. Keiaira Donlan   PA-C  02/14/2016, 3:38 PM

## 2016-03-15 DIAGNOSIS — M25562 Pain in left knee: Secondary | ICD-10-CM | POA: Diagnosis not present

## 2016-03-15 DIAGNOSIS — Z96652 Presence of left artificial knee joint: Secondary | ICD-10-CM | POA: Diagnosis not present

## 2016-03-15 DIAGNOSIS — R269 Unspecified abnormalities of gait and mobility: Secondary | ICD-10-CM | POA: Diagnosis not present

## 2016-03-15 DIAGNOSIS — Z9660 Presence of unspecified orthopedic joint implant: Secondary | ICD-10-CM | POA: Diagnosis not present

## 2016-03-15 DIAGNOSIS — G8929 Other chronic pain: Secondary | ICD-10-CM | POA: Diagnosis not present

## 2016-03-15 DIAGNOSIS — M1712 Unilateral primary osteoarthritis, left knee: Secondary | ICD-10-CM | POA: Diagnosis not present

## 2016-03-15 DIAGNOSIS — M25662 Stiffness of left knee, not elsewhere classified: Secondary | ICD-10-CM | POA: Diagnosis not present

## 2016-03-17 DIAGNOSIS — G8929 Other chronic pain: Secondary | ICD-10-CM | POA: Diagnosis not present

## 2016-03-17 DIAGNOSIS — R269 Unspecified abnormalities of gait and mobility: Secondary | ICD-10-CM | POA: Diagnosis not present

## 2016-03-17 DIAGNOSIS — M1712 Unilateral primary osteoarthritis, left knee: Secondary | ICD-10-CM | POA: Diagnosis not present

## 2016-03-17 DIAGNOSIS — M25562 Pain in left knee: Secondary | ICD-10-CM | POA: Diagnosis not present

## 2016-03-17 DIAGNOSIS — Z96652 Presence of left artificial knee joint: Secondary | ICD-10-CM | POA: Diagnosis not present

## 2016-03-17 DIAGNOSIS — Z471 Aftercare following joint replacement surgery: Secondary | ICD-10-CM | POA: Diagnosis not present

## 2016-03-17 DIAGNOSIS — M25662 Stiffness of left knee, not elsewhere classified: Secondary | ICD-10-CM | POA: Diagnosis not present

## 2016-03-22 DIAGNOSIS — Z471 Aftercare following joint replacement surgery: Secondary | ICD-10-CM | POA: Diagnosis not present

## 2016-03-22 DIAGNOSIS — Z96652 Presence of left artificial knee joint: Secondary | ICD-10-CM | POA: Diagnosis not present

## 2016-05-16 DIAGNOSIS — G8929 Other chronic pain: Secondary | ICD-10-CM | POA: Diagnosis not present

## 2016-05-16 DIAGNOSIS — M25662 Stiffness of left knee, not elsewhere classified: Secondary | ICD-10-CM | POA: Diagnosis not present

## 2016-05-16 DIAGNOSIS — Z96652 Presence of left artificial knee joint: Secondary | ICD-10-CM | POA: Diagnosis not present

## 2016-05-16 DIAGNOSIS — M1712 Unilateral primary osteoarthritis, left knee: Secondary | ICD-10-CM | POA: Diagnosis not present

## 2016-05-16 DIAGNOSIS — M25562 Pain in left knee: Secondary | ICD-10-CM | POA: Diagnosis not present

## 2016-05-16 DIAGNOSIS — R29898 Other symptoms and signs involving the musculoskeletal system: Secondary | ICD-10-CM | POA: Diagnosis not present

## 2016-05-17 DIAGNOSIS — M810 Age-related osteoporosis without current pathological fracture: Secondary | ICD-10-CM | POA: Diagnosis not present

## 2016-05-17 DIAGNOSIS — I10 Essential (primary) hypertension: Secondary | ICD-10-CM | POA: Diagnosis not present

## 2016-05-17 DIAGNOSIS — E784 Other hyperlipidemia: Secondary | ICD-10-CM | POA: Diagnosis not present

## 2016-05-17 DIAGNOSIS — D649 Anemia, unspecified: Secondary | ICD-10-CM | POA: Diagnosis not present

## 2016-05-19 DIAGNOSIS — Z471 Aftercare following joint replacement surgery: Secondary | ICD-10-CM | POA: Diagnosis not present

## 2016-05-19 DIAGNOSIS — Z96652 Presence of left artificial knee joint: Secondary | ICD-10-CM | POA: Diagnosis not present

## 2016-05-19 DIAGNOSIS — M545 Low back pain: Secondary | ICD-10-CM | POA: Diagnosis not present

## 2016-05-19 DIAGNOSIS — M25512 Pain in left shoulder: Secondary | ICD-10-CM | POA: Diagnosis not present

## 2016-05-24 DIAGNOSIS — M810 Age-related osteoporosis without current pathological fracture: Secondary | ICD-10-CM | POA: Diagnosis not present

## 2016-05-24 DIAGNOSIS — I10 Essential (primary) hypertension: Secondary | ICD-10-CM | POA: Diagnosis not present

## 2016-05-24 DIAGNOSIS — K219 Gastro-esophageal reflux disease without esophagitis: Secondary | ICD-10-CM | POA: Diagnosis not present

## 2016-05-24 DIAGNOSIS — D649 Anemia, unspecified: Secondary | ICD-10-CM | POA: Diagnosis not present

## 2016-05-24 DIAGNOSIS — J302 Other seasonal allergic rhinitis: Secondary | ICD-10-CM | POA: Diagnosis not present

## 2016-05-24 DIAGNOSIS — E669 Obesity, unspecified: Secondary | ICD-10-CM | POA: Diagnosis not present

## 2016-05-24 DIAGNOSIS — N183 Chronic kidney disease, stage 3 (moderate): Secondary | ICD-10-CM | POA: Diagnosis not present

## 2016-05-24 DIAGNOSIS — E784 Other hyperlipidemia: Secondary | ICD-10-CM | POA: Diagnosis not present

## 2016-05-24 DIAGNOSIS — Z0000 Encounter for general adult medical examination without abnormal findings: Secondary | ICD-10-CM | POA: Diagnosis not present

## 2016-05-24 DIAGNOSIS — J45909 Unspecified asthma, uncomplicated: Secondary | ICD-10-CM | POA: Diagnosis not present

## 2016-06-14 DIAGNOSIS — M419 Scoliosis, unspecified: Secondary | ICD-10-CM | POA: Diagnosis not present

## 2016-06-14 DIAGNOSIS — M5136 Other intervertebral disc degeneration, lumbar region: Secondary | ICD-10-CM | POA: Diagnosis not present

## 2016-07-05 DIAGNOSIS — M12812 Other specific arthropathies, not elsewhere classified, left shoulder: Secondary | ICD-10-CM | POA: Diagnosis not present

## 2016-07-05 DIAGNOSIS — Z96652 Presence of left artificial knee joint: Secondary | ICD-10-CM | POA: Diagnosis not present

## 2016-07-05 DIAGNOSIS — Z471 Aftercare following joint replacement surgery: Secondary | ICD-10-CM | POA: Diagnosis not present

## 2016-07-05 DIAGNOSIS — M545 Low back pain: Secondary | ICD-10-CM | POA: Diagnosis not present

## 2016-07-05 DIAGNOSIS — M25512 Pain in left shoulder: Secondary | ICD-10-CM | POA: Diagnosis not present

## 2016-07-26 DIAGNOSIS — J029 Acute pharyngitis, unspecified: Secondary | ICD-10-CM | POA: Diagnosis not present

## 2016-07-26 DIAGNOSIS — Z6831 Body mass index (BMI) 31.0-31.9, adult: Secondary | ICD-10-CM | POA: Diagnosis not present

## 2016-07-26 DIAGNOSIS — J209 Acute bronchitis, unspecified: Secondary | ICD-10-CM | POA: Diagnosis not present

## 2016-07-26 DIAGNOSIS — R05 Cough: Secondary | ICD-10-CM | POA: Diagnosis not present

## 2016-09-12 DIAGNOSIS — D649 Anemia, unspecified: Secondary | ICD-10-CM | POA: Diagnosis not present

## 2016-09-12 DIAGNOSIS — N183 Chronic kidney disease, stage 3 (moderate): Secondary | ICD-10-CM | POA: Diagnosis not present

## 2016-09-12 DIAGNOSIS — D6489 Other specified anemias: Secondary | ICD-10-CM | POA: Diagnosis not present

## 2016-09-19 DIAGNOSIS — D6489 Other specified anemias: Secondary | ICD-10-CM | POA: Diagnosis not present

## 2016-09-19 DIAGNOSIS — E668 Other obesity: Secondary | ICD-10-CM | POA: Diagnosis not present

## 2016-09-19 DIAGNOSIS — I10 Essential (primary) hypertension: Secondary | ICD-10-CM | POA: Diagnosis not present

## 2016-09-19 DIAGNOSIS — Z6831 Body mass index (BMI) 31.0-31.9, adult: Secondary | ICD-10-CM | POA: Diagnosis not present

## 2016-09-19 DIAGNOSIS — N183 Chronic kidney disease, stage 3 (moderate): Secondary | ICD-10-CM | POA: Diagnosis not present

## 2016-09-19 DIAGNOSIS — K219 Gastro-esophageal reflux disease without esophagitis: Secondary | ICD-10-CM | POA: Diagnosis not present

## 2016-10-11 DIAGNOSIS — H5213 Myopia, bilateral: Secondary | ICD-10-CM | POA: Diagnosis not present

## 2016-10-31 DIAGNOSIS — R197 Diarrhea, unspecified: Secondary | ICD-10-CM | POA: Diagnosis not present

## 2016-10-31 DIAGNOSIS — J111 Influenza due to unidentified influenza virus with other respiratory manifestations: Secondary | ICD-10-CM | POA: Diagnosis not present

## 2016-10-31 DIAGNOSIS — Z6831 Body mass index (BMI) 31.0-31.9, adult: Secondary | ICD-10-CM | POA: Diagnosis not present

## 2016-10-31 DIAGNOSIS — R05 Cough: Secondary | ICD-10-CM | POA: Diagnosis not present

## 2017-01-26 DIAGNOSIS — E669 Obesity, unspecified: Secondary | ICD-10-CM | POA: Diagnosis not present

## 2017-01-26 DIAGNOSIS — E784 Other hyperlipidemia: Secondary | ICD-10-CM | POA: Diagnosis not present

## 2017-01-26 DIAGNOSIS — I10 Essential (primary) hypertension: Secondary | ICD-10-CM | POA: Diagnosis not present

## 2017-01-26 DIAGNOSIS — N183 Chronic kidney disease, stage 3 (moderate): Secondary | ICD-10-CM | POA: Diagnosis not present

## 2017-02-12 DIAGNOSIS — Z96652 Presence of left artificial knee joint: Secondary | ICD-10-CM | POA: Diagnosis not present

## 2017-02-12 DIAGNOSIS — Z471 Aftercare following joint replacement surgery: Secondary | ICD-10-CM | POA: Diagnosis not present

## 2017-02-12 DIAGNOSIS — M25562 Pain in left knee: Secondary | ICD-10-CM | POA: Diagnosis not present

## 2017-02-12 DIAGNOSIS — M25512 Pain in left shoulder: Secondary | ICD-10-CM | POA: Diagnosis not present

## 2017-02-12 DIAGNOSIS — M12812 Other specific arthropathies, not elsewhere classified, left shoulder: Secondary | ICD-10-CM | POA: Diagnosis not present

## 2017-02-12 DIAGNOSIS — M25561 Pain in right knee: Secondary | ICD-10-CM | POA: Diagnosis not present

## 2017-04-05 ENCOUNTER — Encounter (HOSPITAL_COMMUNITY)
Admission: RE | Admit: 2017-04-05 | Discharge: 2017-04-05 | Disposition: A | Discharge status: Home / Self Care | Payer: Medicare Other | Source: Ambulatory Visit | Attending: Orthopedic Surgery | Admitting: Orthopedic Surgery

## 2017-04-05 ENCOUNTER — Encounter (HOSPITAL_COMMUNITY): Payer: Self-pay

## 2017-04-05 DIAGNOSIS — I129 Hypertensive chronic kidney disease with stage 1 through stage 4 chronic kidney disease, or unspecified chronic kidney disease: Secondary | ICD-10-CM | POA: Diagnosis not present

## 2017-04-05 DIAGNOSIS — D649 Anemia, unspecified: Secondary | ICD-10-CM | POA: Diagnosis not present

## 2017-04-05 DIAGNOSIS — N183 Chronic kidney disease, stage 3 (moderate): Secondary | ICD-10-CM | POA: Diagnosis not present

## 2017-04-05 DIAGNOSIS — R9431 Abnormal electrocardiogram [ECG] [EKG]: Secondary | ICD-10-CM | POA: Insufficient documentation

## 2017-04-05 DIAGNOSIS — F329 Major depressive disorder, single episode, unspecified: Secondary | ICD-10-CM | POA: Diagnosis not present

## 2017-04-05 DIAGNOSIS — Z87442 Personal history of urinary calculi: Secondary | ICD-10-CM | POA: Diagnosis not present

## 2017-04-05 DIAGNOSIS — Z01812 Encounter for preprocedural laboratory examination: Secondary | ICD-10-CM | POA: Diagnosis not present

## 2017-04-05 DIAGNOSIS — Z01810 Encounter for preprocedural cardiovascular examination: Secondary | ICD-10-CM | POA: Insufficient documentation

## 2017-04-05 DIAGNOSIS — M19019 Primary osteoarthritis, unspecified shoulder: Secondary | ICD-10-CM | POA: Insufficient documentation

## 2017-04-05 DIAGNOSIS — K219 Gastro-esophageal reflux disease without esophagitis: Secondary | ICD-10-CM | POA: Diagnosis not present

## 2017-04-05 HISTORY — DX: Dyspnea, unspecified: R06.00

## 2017-04-05 HISTORY — DX: Chronic kidney disease, unspecified: N18.9

## 2017-04-05 HISTORY — DX: Herpesviral infection, unspecified: B00.9

## 2017-04-05 HISTORY — DX: Personal history of urinary calculi: Z87.442

## 2017-04-05 HISTORY — DX: Constipation, unspecified: K59.00

## 2017-04-05 HISTORY — DX: Other seasonal allergic rhinitis: J30.2

## 2017-04-05 HISTORY — DX: Acute embolism and thrombosis of unspecified deep veins of unspecified lower extremity: I82.409

## 2017-04-05 HISTORY — DX: Unspecified osteoarthritis, unspecified site: M19.90

## 2017-04-05 HISTORY — DX: Anxiety disorder, unspecified: F41.9

## 2017-04-05 HISTORY — DX: Unspecified asthma, uncomplicated: J45.909

## 2017-04-05 LAB — SURGICAL PCR SCREEN
MRSA, PCR: NEGATIVE
Staphylococcus aureus: POSITIVE — AB

## 2017-04-05 LAB — CBC
HCT: 34.9 % — ABNORMAL LOW (ref 36.0–46.0)
HEMOGLOBIN: 11.4 g/dL — AB (ref 12.0–15.0)
MCH: 29 pg (ref 26.0–34.0)
MCHC: 32.7 g/dL (ref 30.0–36.0)
MCV: 88.8 fL (ref 78.0–100.0)
Platelets: 285 10*3/uL (ref 150–400)
RBC: 3.93 MIL/uL (ref 3.87–5.11)
RDW: 14.2 % (ref 11.5–15.5)
WBC: 5.7 10*3/uL (ref 4.0–10.5)

## 2017-04-05 LAB — BASIC METABOLIC PANEL
ANION GAP: 10 (ref 5–15)
BUN: 8 mg/dL (ref 6–20)
CALCIUM: 9.4 mg/dL (ref 8.9–10.3)
CO2: 25 mmol/L (ref 22–32)
Chloride: 95 mmol/L — ABNORMAL LOW (ref 101–111)
Creatinine, Ser: 1.18 mg/dL — ABNORMAL HIGH (ref 0.44–1.00)
GFR calc non Af Amer: 46 mL/min — ABNORMAL LOW (ref >60)
GFR, EST AFRICAN AMERICAN: 53 mL/min — AB (ref >60)
Glucose, Bld: 107 mg/dL — ABNORMAL HIGH (ref 65–99)
Potassium: 3.4 mmol/L — ABNORMAL LOW (ref 3.5–5.1)
SODIUM: 130 mmol/L — AB (ref 135–145)

## 2017-04-05 NOTE — Progress Notes (Signed)
I called a [place] for [place]iro[place] ointment to [facility] [place].

## 2017-04-05 NOTE — Pre-Procedure Instructions (Signed)
Nigel SloopBrenda Shingler  04/05/2017     Your procedure is scheduled on Thursday, June 28.  [facility] Ad[facility] at 5:30 AM              Your surgery or procedure is scheduled for 7:30 AM   Call this number if you have problems the morning of surgery: [phone]                For any other questions, please call [phone], Monday - Friday 8 AM - 4 PM.  Remember:  Do not eat food or drink liquids after midnight Wednesday, June 27  Take these medicines the morning of surgery with A SIP OF WATER : amLODipine (NORVASC), buPROPion (WELLBUTRIN SR), cetirizine (ZYRTEC), DULoxetine (CYMBALTA), omeprazole (PRILOSEC OTC), pantoprazole (PROTONIX), pravastatin (PRAVACHOL).               May use fluticasone (FLONASE) nose spray. May take if needed : meclizine (ANTIVERT), acetaminophen (TYLENOL).  Stop rivaroxaban (XARELTO) as instructed by Dr Rennis ChrisSupple. Special instructions:   - Preparing For Surgery  Before surgery, you can play an important role. Because skin is not sterile, your skin needs to be as free of germs as possible. You can reduce the number of germs on your skin by washing with CHG (chlorahexidine gluconate) Soap before surgery.  CHG is an antiseptic cleaner which kills germs and bonds with the skin to continue killing germs even after washing.  Please do not use if you have an allergy to CHG or antibacterial soaps. If your skin becomes reddened/irritated stop using the CHG.  Do not shave (including legs and underarms) for at least 48 hours prior to first CHG shower. It is OK to shave your face.  Please follow these instructions carefully.   1. Shower the NIGHT BEFORE SURGERY and the MORNING OF SURGERY with CHG.   2. If you chose to wash your hair, wash your hair first as usual with your normal shampoo.  3. After you shampoo, rinse your hair and body thoroughly to remove the shampoo.  4. Use CHG as you would any other liquid soap. You can apply CHG directly  to the skin and wash gently with a scrungie or a clean washcloth.   5. Apply the CHG Soap to your body ONLY FROM THE NECK DOWN.  Do not use on open wounds or open sores. Avoid contact with your eyes, ears, mouth and genitals (private parts). Wash genitals (private parts) with your normal soap.  6. Wash thoroughly, paying special attention to the area where your surgery will be performed.  7. Thoroughly rinse your body with warm water from the neck down.  8. DO NOT shower/wash with your normal soap after using and rinsing off the CHG Soap.  9. Pat yourself dry with a CLEAN TOWEL.   10. Wear CLEAN PAJAMAS   11. Place CLEAN SHEETS on your bed the night of your first shower and DO NOT SLEEP WITH PETS.  Day of Surgery: Shower as above Do not apply any deodorants/lotions ,powders, or perfumes. Please wear clean clothes to the hospital/surgery center.    Do not wear jewelry, make-up or nail polish.  Do not wear lotions, powders, or perfumes, or deoderant.  Do not shave 48 hours prior to surgery.  Men may shave face and neck.  Do not bring valuables to the hospital.  [facility] not [facility] any belongings or valuables.  Contacts, dentures or bridgework may not be  worn into surgery.  Leave your suitcase in the car.  After surgery it may be brought to your room.  For patients admitted to the hospital, discharge time will be determined by your treatment team.  Patients discharged the day of surgery will not be allowed to drive home.   Please read over the following fact sheets that you were given: Pain Booklet, Patient Instructions for Mupirocin Application, Incentive Spirometry, Surgical Site Infections.

## 2017-04-11 NOTE — Anesthesia Preprocedure Evaluation (Addendum)
Anesthesia Evaluation  Patient identified by MRN, date of birth, ID band Patient awake    Reviewed: Allergy & Precautions, NPO status , Patient's Chart, lab work & pertinent test results  Airway Mallampati: II  TM Distance: >3 FB Neck ROM: Full    Dental  (+) Teeth Intact, Dental Advisory Given,    Pulmonary asthma ,    Pulmonary exam normal        Cardiovascular hypertension, Pt. on medications  Rhythm:Regular Rate:Normal     Neuro/Psych Anxiety Depression negative neurological ROS     GI/Hepatic Neg liver ROS, GERD  ,  Endo/Other  negative endocrine ROS  Renal/GU Renal InsufficiencyRenal disease     Musculoskeletal  (+) Arthritis ,   Abdominal   Peds  Hematology  (+) anemia ,   Anesthesia Other Findings   Reproductive/Obstetrics                           Lab Results  Component Value Date   WBC 5.7 04/05/2017   HGB 11.4 (L) 04/05/2017   HCT 34.9 (L) 04/05/2017   MCV 88.8 04/05/2017   PLT 285 04/05/2017   Lab Results  Component Value Date   CREATININE 1.18 (H) 04/05/2017   BUN 8 04/05/2017   NA 130 (L) 04/05/2017   K 3.4 (L) 04/05/2017   CL 95 (L) 04/05/2017   CO2 25 04/05/2017    Anesthesia Physical Anesthesia Plan  ASA: II  Anesthesia Plan: General   Post-op Pain Management:  Regional for Post-op pain   Induction: Intravenous  PONV Risk Score and Plan: 3 and Ondansetron, Dexamethasone, Propofol and Treatment may vary due to age or medical condition  Airway Management Planned: Oral ETT  Additional Equipment:   Intra-op Plan:   Post-operative Plan: Extubation in OR  Informed Consent: I have reviewed the patients History and Physical, chart, labs and discussed the procedure including the risks, benefits and alternatives for the proposed anesthesia with the patient or authorized representative who has indicated his/her understanding and acceptance.   Dental  advisory given  Plan Discussed with: CRNA  Anesthesia Plan Comments:        Anesthesia Quick Evaluation

## 2017-04-12 ENCOUNTER — Inpatient Hospital Stay (HOSPITAL_COMMUNITY): Payer: Medicare Other | Admitting: Emergency Medicine

## 2017-04-12 ENCOUNTER — Inpatient Hospital Stay (HOSPITAL_COMMUNITY)
Admission: RE | Admit: 2017-04-12 | Discharge: 2017-04-13 | DRG: 483 | Disposition: A | Discharge status: Home / Self Care | Payer: Medicare Other | Source: Ambulatory Visit | Attending: Orthopedic Surgery | Admitting: Orthopedic Surgery

## 2017-04-12 ENCOUNTER — Inpatient Hospital Stay (HOSPITAL_COMMUNITY): Payer: Medicare Other | Admitting: Anesthesiology

## 2017-04-12 ENCOUNTER — Encounter (HOSPITAL_COMMUNITY): Payer: Self-pay | Admitting: Urology

## 2017-04-12 ENCOUNTER — Encounter (HOSPITAL_COMMUNITY)
Admission: RE | Disposition: A | Discharge status: Home / Self Care | Payer: Self-pay | Source: Ambulatory Visit | Attending: Orthopedic Surgery

## 2017-04-12 DIAGNOSIS — F418 Other specified anxiety disorders: Secondary | ICD-10-CM | POA: Diagnosis not present

## 2017-04-12 DIAGNOSIS — Z79899 Other long term (current) drug therapy: Secondary | ICD-10-CM | POA: Diagnosis not present

## 2017-04-12 DIAGNOSIS — G8918 Other acute postprocedural pain: Secondary | ICD-10-CM | POA: Diagnosis not present

## 2017-04-12 DIAGNOSIS — Z880 Allergy status to penicillin: Secondary | ICD-10-CM | POA: Diagnosis not present

## 2017-04-12 DIAGNOSIS — K219 Gastro-esophageal reflux disease without esophagitis: Secondary | ICD-10-CM | POA: Diagnosis not present

## 2017-04-12 DIAGNOSIS — F419 Anxiety disorder, unspecified: Secondary | ICD-10-CM | POA: Diagnosis not present

## 2017-04-12 DIAGNOSIS — N189 Chronic kidney disease, unspecified: Secondary | ICD-10-CM | POA: Diagnosis present

## 2017-04-12 DIAGNOSIS — Z86718 Personal history of other venous thrombosis and embolism: Secondary | ICD-10-CM | POA: Diagnosis not present

## 2017-04-12 DIAGNOSIS — M19012 Primary osteoarthritis, left shoulder: Secondary | ICD-10-CM | POA: Diagnosis not present

## 2017-04-12 DIAGNOSIS — I129 Hypertensive chronic kidney disease with stage 1 through stage 4 chronic kidney disease, or unspecified chronic kidney disease: Secondary | ICD-10-CM | POA: Diagnosis not present

## 2017-04-12 DIAGNOSIS — J45909 Unspecified asthma, uncomplicated: Secondary | ICD-10-CM | POA: Diagnosis present

## 2017-04-12 DIAGNOSIS — Z96652 Presence of left artificial knee joint: Secondary | ICD-10-CM | POA: Diagnosis not present

## 2017-04-12 DIAGNOSIS — Z96619 Presence of unspecified artificial shoulder joint: Secondary | ICD-10-CM

## 2017-04-12 DIAGNOSIS — I10 Essential (primary) hypertension: Secondary | ICD-10-CM | POA: Diagnosis not present

## 2017-04-12 DIAGNOSIS — M13812 Other specified arthritis, left shoulder: Secondary | ICD-10-CM | POA: Diagnosis present

## 2017-04-12 DIAGNOSIS — M12812 Other specific arthropathies, not elsewhere classified, left shoulder: Secondary | ICD-10-CM | POA: Diagnosis not present

## 2017-04-12 DIAGNOSIS — M75102 Unspecified rotator cuff tear or rupture of left shoulder, not specified as traumatic: Secondary | ICD-10-CM | POA: Diagnosis not present

## 2017-04-12 HISTORY — PX: REVERSE SHOULDER ARTHROPLASTY: SHX5054

## 2017-04-12 SURGERY — ARTHROPLASTY, SHOULDER, TOTAL, REVERSE
Anesthesia: General | Site: Shoulder | Laterality: Left

## 2017-04-12 MED ORDER — ROCURONIUM BROMIDE 100 MG/10ML IV SOLN
INTRAVENOUS | Status: DC | PRN
  Administered 2017-04-12: 50 mg via INTRAVENOUS

## 2017-04-12 MED ORDER — 0.9 % SODIUM CHLORIDE (POUR BTL) OPTIME
TOPICAL | Status: DC | PRN
  Administered 2017-04-12: 1000 mL

## 2017-04-12 MED ORDER — MENTHOL 3 MG MT LOZG: 1.0000 | LOZENGE | OROMUCOSAL | Status: DC | PRN

## 2017-04-12 MED ORDER — BUPIVACAINE-EPINEPHRINE (PF) 0.5% -1:200000 IJ SOLN
INTRAMUSCULAR | Status: DC | PRN
  Administered 2017-04-12: 25 mL via PERINEURAL

## 2017-04-12 MED ORDER — EPHEDRINE 5 MG/ML INJ
INTRAVENOUS | Status: AC
Start: 2017-04-12 — End: 2017-04-12
  Filled 2017-04-12: qty 10

## 2017-04-12 MED ORDER — SUGAMMADEX SODIUM 200 MG/2ML IV SOLN
INTRAVENOUS | Status: AC
  Filled 2017-04-12: qty 2

## 2017-04-12 MED ORDER — ONDANSETRON HCL 4 MG PO TABS: 4.0000 mg | ORAL_TABLET | Freq: Four times a day (QID) | ORAL | Status: DC | PRN

## 2017-04-12 MED ORDER — PROMETHAZINE HCL 25 MG/ML IJ SOLN: 6.2500 mg | INTRAMUSCULAR | Status: DC | PRN

## 2017-04-12 MED ORDER — SUGAMMADEX SODIUM 200 MG/2ML IV SOLN
INTRAVENOUS | Status: DC | PRN
  Administered 2017-04-12: 200 mg via INTRAVENOUS

## 2017-04-12 MED ORDER — SODIUM CHLORIDE 0.9 % IV SOLN
2000.0000 mg | INTRAVENOUS | Status: AC
  Administered 2017-04-12: 2000 mg via TOPICAL
  Filled 2017-04-12: qty 20

## 2017-04-12 MED ORDER — ALUM & MAG HYDROXIDE-SIMETH 200-200-20 MG/5ML PO SUSP
30.0000 mL | ORAL | Status: DC | PRN
  Administered 2017-04-12: 30 mL via ORAL
  Filled 2017-04-12: qty 30

## 2017-04-12 MED ORDER — HYDROMORPHONE HCL 1 MG/ML IJ SOLN
INTRAMUSCULAR | Status: AC
  Administered 2017-04-12: 0.5 mg via INTRAVENOUS
  Filled 2017-04-12: qty 1

## 2017-04-12 MED ORDER — DULOXETINE HCL 30 MG PO CPEP
30.0000 mg | ORAL_CAPSULE | Freq: Every day | ORAL | Status: DC
  Administered 2017-04-13: 30 mg via ORAL
  Filled 2017-04-12: qty 1

## 2017-04-12 MED ORDER — PRAVASTATIN SODIUM 40 MG PO TABS
40.0000 mg | ORAL_TABLET | Freq: Every morning | ORAL | Status: DC
  Administered 2017-04-12: 40 mg via ORAL
  Filled 2017-04-12: qty 1

## 2017-04-12 MED ORDER — PROPOFOL 10 MG/ML IV BOLUS
INTRAVENOUS | Status: DC | PRN
  Administered 2017-04-12: 20 mg via INTRAVENOUS
  Administered 2017-04-12: 130 mg via INTRAVENOUS
  Administered 2017-04-12: 20 mg via INTRAVENOUS

## 2017-04-12 MED ORDER — DOCUSATE SODIUM 100 MG PO CAPS
100.0000 mg | ORAL_CAPSULE | Freq: Two times a day (BID) | ORAL | Status: DC
  Administered 2017-04-12 – 2017-04-13 (×2): 100 mg via ORAL
  Filled 2017-04-12 (×2): qty 1

## 2017-04-12 MED ORDER — ONDANSETRON HCL 4 MG/2ML IJ SOLN
INTRAMUSCULAR | Status: DC | PRN
  Administered 2017-04-12: 4 mg via INTRAVENOUS

## 2017-04-12 MED ORDER — EPHEDRINE SULFATE 50 MG/ML IJ SOLN
INTRAMUSCULAR | Status: DC | PRN
  Administered 2017-04-12 (×2): 10 mg via INTRAVENOUS
  Administered 2017-04-12 (×2): 5 mg via INTRAVENOUS
  Administered 2017-04-12: 10 mg via INTRAVENOUS

## 2017-04-12 MED ORDER — LISINOPRIL 20 MG PO TABS
20.0000 mg | ORAL_TABLET | Freq: Two times a day (BID) | ORAL | Status: DC
  Administered 2017-04-12 (×2): 20 mg via ORAL
  Filled 2017-04-12 (×2): qty 1

## 2017-04-12 MED ORDER — PHENYLEPHRINE HCL 10 MG/ML IJ SOLN
INTRAMUSCULAR | Status: DC | PRN
  Administered 2017-04-12: 10 ug/min via INTRAVENOUS

## 2017-04-12 MED ORDER — KETOROLAC TROMETHAMINE 15 MG/ML IJ SOLN
7.5000 mg | Freq: Four times a day (QID) | INTRAMUSCULAR | Status: AC
  Administered 2017-04-12 – 2017-04-13 (×4): 7.5 mg via INTRAVENOUS
  Filled 2017-04-12 (×3): qty 1

## 2017-04-12 MED ORDER — PROPOFOL 10 MG/ML IV BOLUS
INTRAVENOUS | Status: AC
  Filled 2017-04-12: qty 20

## 2017-04-12 MED ORDER — DIAZEPAM 2 MG PO TABS: 2.0000 mg | ORAL_TABLET | Freq: Four times a day (QID) | ORAL | Status: DC | PRN

## 2017-04-12 MED ORDER — ONDANSETRON HCL 4 MG/2ML IJ SOLN
INTRAMUSCULAR | Status: AC
  Filled 2017-04-12: qty 2

## 2017-04-12 MED ORDER — MIDAZOLAM HCL 2 MG/2ML IJ SOLN
INTRAMUSCULAR | Status: AC
  Filled 2017-04-12: qty 2

## 2017-04-12 MED ORDER — HYDROCHLOROTHIAZIDE 25 MG PO TABS: 25.0000 mg | ORAL_TABLET | Freq: Every morning | ORAL | Status: DC

## 2017-04-12 MED ORDER — DEXAMETHASONE SODIUM PHOSPHATE 10 MG/ML IJ SOLN
INTRAMUSCULAR | Status: DC | PRN
  Administered 2017-04-12: 10 mg via INTRAVENOUS

## 2017-04-12 MED ORDER — LIDOCAINE HCL (CARDIAC) 20 MG/ML IV SOLN
INTRAVENOUS | Status: DC | PRN
  Administered 2017-04-12: 20 mg via INTRAVENOUS

## 2017-04-12 MED ORDER — METOCLOPRAMIDE HCL 5 MG PO TABS: 5.0000 mg | ORAL_TABLET | Freq: Three times a day (TID) | ORAL | Status: DC | PRN

## 2017-04-12 MED ORDER — ACETAMINOPHEN 325 MG PO TABS
ORAL_TABLET | ORAL | Status: AC
  Filled 2017-04-12: qty 2

## 2017-04-12 MED ORDER — ONDANSETRON HCL 4 MG/2ML IJ SOLN: 4.0000 mg | Freq: Four times a day (QID) | INTRAMUSCULAR | Status: DC | PRN

## 2017-04-12 MED ORDER — POLYETHYLENE GLYCOL 3350 17 G PO PACK: 17.0000 g | PACK | Freq: Every day | ORAL | Status: DC | PRN

## 2017-04-12 MED ORDER — OXYCODONE HCL 5 MG PO TABS
ORAL_TABLET | ORAL | Status: AC
  Filled 2017-04-12: qty 2

## 2017-04-12 MED ORDER — BUPROPION HCL ER (SR) 150 MG PO TB12
150.0000 mg | ORAL_TABLET | Freq: Every morning | ORAL | Status: DC
  Administered 2017-04-13: 150 mg via ORAL
  Filled 2017-04-12: qty 1

## 2017-04-12 MED ORDER — LORATADINE 10 MG PO TABS
10.0000 mg | ORAL_TABLET | Freq: Every day | ORAL | Status: DC
  Administered 2017-04-13: 10 mg via ORAL
  Filled 2017-04-12: qty 1

## 2017-04-12 MED ORDER — KETOROLAC TROMETHAMINE 15 MG/ML IJ SOLN
INTRAMUSCULAR | Status: AC
  Filled 2017-04-12: qty 1

## 2017-04-12 MED ORDER — LIDOCAINE 2% (20 MG/ML) 5 ML SYRINGE
INTRAMUSCULAR | Status: AC
  Filled 2017-04-12: qty 5

## 2017-04-12 MED ORDER — DIPHENHYDRAMINE HCL 12.5 MG/5ML PO ELIX: 12.5000 mg | ORAL_SOLUTION | ORAL | Status: DC | PRN

## 2017-04-12 MED ORDER — FENTANYL CITRATE (PF) 100 MCG/2ML IJ SOLN
INTRAMUSCULAR | Status: DC | PRN
Start: 2017-04-12 — End: 2017-04-12
  Administered 2017-04-12: 25 ug via INTRAVENOUS
  Administered 2017-04-12: 50 ug via INTRAVENOUS
  Administered 2017-04-12: 25 ug via INTRAVENOUS

## 2017-04-12 MED ORDER — PANTOPRAZOLE SODIUM 40 MG PO TBEC
40.0000 mg | DELAYED_RELEASE_TABLET | Freq: Two times a day (BID) | ORAL | Status: DC
  Administered 2017-04-12 – 2017-04-13 (×2): 40 mg via ORAL
  Filled 2017-04-12 (×2): qty 1

## 2017-04-12 MED ORDER — HYDROMORPHONE HCL 1 MG/ML IJ SOLN: 1.0000 mg | INTRAMUSCULAR | Status: DC | PRN

## 2017-04-12 MED ORDER — PHENOL 1.4 % MT LIQD: 1.0000 | OROMUCOSAL | Status: DC | PRN

## 2017-04-12 MED ORDER — CEFAZOLIN SODIUM-DEXTROSE 2-4 GM/100ML-% IV SOLN
INTRAVENOUS | Status: AC
Start: 2017-04-12 — End: 2017-04-12
  Filled 2017-04-12: qty 100

## 2017-04-12 MED ORDER — MECLIZINE HCL 25 MG PO TABS: 25.0000 mg | ORAL_TABLET | Freq: Four times a day (QID) | ORAL | Status: DC | PRN

## 2017-04-12 MED ORDER — METOCLOPRAMIDE HCL 5 MG/ML IJ SOLN: 5.0000 mg | Freq: Three times a day (TID) | INTRAMUSCULAR | Status: DC | PRN

## 2017-04-12 MED ORDER — ROCURONIUM BROMIDE 10 MG/ML (PF) SYRINGE
PREFILLED_SYRINGE | INTRAVENOUS | Status: AC
  Filled 2017-04-12: qty 5

## 2017-04-12 MED ORDER — BISACODYL 5 MG PO TBEC: 5.0000 mg | DELAYED_RELEASE_TABLET | Freq: Every day | ORAL | Status: DC | PRN

## 2017-04-12 MED ORDER — ACETAMINOPHEN 325 MG PO TABS
650.0000 mg | ORAL_TABLET | Freq: Four times a day (QID) | ORAL | Status: DC | PRN
  Administered 2017-04-12: 650 mg via ORAL

## 2017-04-12 MED ORDER — FENTANYL CITRATE (PF) 250 MCG/5ML IJ SOLN
INTRAMUSCULAR | Status: AC
  Filled 2017-04-12: qty 5

## 2017-04-12 MED ORDER — CEFAZOLIN SODIUM-DEXTROSE 2-4 GM/100ML-% IV SOLN
2.0000 g | Freq: Four times a day (QID) | INTRAVENOUS | Status: AC
  Administered 2017-04-12 – 2017-04-13 (×2): 2 g via INTRAVENOUS
  Filled 2017-04-12 (×3): qty 100

## 2017-04-12 MED ORDER — OXYCODONE HCL 5 MG PO TABS
5.0000 mg | ORAL_TABLET | ORAL | Status: DC | PRN
  Administered 2017-04-12 – 2017-04-13 (×5): 10 mg via ORAL
  Filled 2017-04-12 (×4): qty 2

## 2017-04-12 MED ORDER — AMLODIPINE BESYLATE 10 MG PO TABS: 10.0000 mg | ORAL_TABLET | Freq: Every day | ORAL | Status: DC

## 2017-04-12 MED ORDER — CHLORHEXIDINE GLUCONATE 4 % EX LIQD: 60.0000 mL | Freq: Once | CUTANEOUS | Status: DC

## 2017-04-12 MED ORDER — LACTATED RINGERS IV SOLN
INTRAVENOUS | Status: DC
  Administered 2017-04-12: 10:00:00 via INTRAVENOUS

## 2017-04-12 MED ORDER — LACTATED RINGERS IV SOLN
INTRAVENOUS | Status: DC | PRN
  Administered 2017-04-12: 07:00:00 via INTRAVENOUS

## 2017-04-12 MED ORDER — ACETAMINOPHEN 650 MG RE SUPP: 650.0000 mg | Freq: Four times a day (QID) | RECTAL | Status: DC | PRN

## 2017-04-12 MED ORDER — DEXAMETHASONE SODIUM PHOSPHATE 10 MG/ML IJ SOLN
INTRAMUSCULAR | Status: AC
  Filled 2017-04-12: qty 1

## 2017-04-12 MED ORDER — MAGNESIUM CITRATE PO SOLN: 1.0000 | Freq: Once | ORAL | Status: DC | PRN

## 2017-04-12 MED ORDER — HYDROMORPHONE HCL 1 MG/ML IJ SOLN
0.2500 mg | INTRAMUSCULAR | Status: DC | PRN
  Administered 2017-04-12 (×2): 0.5 mg via INTRAVENOUS

## 2017-04-12 SURGICAL SUPPLY — 65 items
BAG DECANTER FOR FLEXI CONT (MISCELLANEOUS) ×2 IMPLANT
BASEPLATE GLENOID SHLDR SM (Shoulder) ×2 IMPLANT
BLADE SAW SGTL 83.5X18.5 (BLADE) ×2 IMPLANT
COVER SURGICAL LIGHT HANDLE (MISCELLANEOUS) ×2 IMPLANT
CUP SUT UNIV REVERS 36+2 LEFT (Cup) ×2 IMPLANT
DERMABOND ADVANCED (GAUZE/BANDAGES/DRESSINGS) ×1
DERMABOND ADVANCED .7 DNX12 (GAUZE/BANDAGES/DRESSINGS) ×1 IMPLANT
DRAPE ORTHO SPLIT 77X108 STRL (DRAPES) ×2
DRAPE SURG 17X11 SM STRL (DRAPES) ×2 IMPLANT
DRAPE SURG ORHT 6 SPLT 77X108 (DRAPES) ×2 IMPLANT
DRAPE U-SHAPE 47X51 STRL (DRAPES) ×2 IMPLANT
DRSG AQUACEL AG ADV 3.5X10 (GAUZE/BANDAGES/DRESSINGS) ×2 IMPLANT
DURAPREP 26ML APPLICATOR (WOUND CARE) ×2 IMPLANT
ELECT BLADE 4.0 EZ CLEAN MEGAD (MISCELLANEOUS) ×2
ELECT CAUTERY BLADE 6.4 (BLADE) ×2 IMPLANT
ELECT REM PT RETURN 9FT ADLT (ELECTROSURGICAL) ×2
ELECTRODE BLDE 4.0 EZ CLN MEGD (MISCELLANEOUS) ×1 IMPLANT
ELECTRODE REM PT RTRN 9FT ADLT (ELECTROSURGICAL) ×1 IMPLANT
FACESHIELD WRAPAROUND (MASK) ×6 IMPLANT
GLENOSPHERE LATERAL 36MM+4 (Shoulder) ×2 IMPLANT
GLOVE BIO SURGEON STRL SZ7.5 (GLOVE) ×2 IMPLANT
GLOVE BIO SURGEON STRL SZ8 (GLOVE) ×2 IMPLANT
GLOVE BIOGEL PI IND STRL 7.0 (GLOVE) ×1 IMPLANT
GLOVE BIOGEL PI INDICATOR 7.0 (GLOVE) ×1
GLOVE EUDERMIC 7 POWDERFREE (GLOVE) ×2 IMPLANT
GLOVE SS BIOGEL STRL SZ 7.5 (GLOVE) ×1 IMPLANT
GLOVE SUPERSENSE BIOGEL SZ 7.5 (GLOVE) ×1
GLOVE SURG SS PI 6.5 STRL IVOR (GLOVE) ×4 IMPLANT
GOWN STRL REUS W/ TWL LRG LVL3 (GOWN DISPOSABLE) ×1 IMPLANT
GOWN STRL REUS W/ TWL XL LVL3 (GOWN DISPOSABLE) ×2 IMPLANT
GOWN STRL REUS W/TWL LRG LVL3 (GOWN DISPOSABLE) ×1
GOWN STRL REUS W/TWL XL LVL3 (GOWN DISPOSABLE) ×2
KIT BASIN OR (CUSTOM PROCEDURE TRAY) ×2 IMPLANT
KIT ROOM TURNOVER OR (KITS) ×2 IMPLANT
LINER HUMERAL 36 +3MM SM (Shoulder) ×2 IMPLANT
MANIFOLD NEPTUNE II (INSTRUMENTS) ×2 IMPLANT
NDL SUT 6 .5 CRC .975X.05 MAYO (NEEDLE) ×1 IMPLANT
NEEDLE HYPO 25GX1X1/2 BEV (NEEDLE) IMPLANT
NEEDLE MAYO TAPER (NEEDLE) ×1
NEEDLE TAPERED W/ NITINOL LOOP (MISCELLANEOUS) ×2 IMPLANT
NS IRRIG 1000ML POUR BTL (IV SOLUTION) ×2 IMPLANT
PACK SHOULDER (CUSTOM PROCEDURE TRAY) ×2 IMPLANT
PAD ARMBOARD 7.5X6 YLW CONV (MISCELLANEOUS) ×4 IMPLANT
RESTRAINT HEAD UNIVERSAL NS (MISCELLANEOUS) ×2 IMPLANT
SCREW CENTRAL NONLOCK 6.5X20MM (Shoulder) ×2 IMPLANT
SCREW LOCK PERIPHERAL 30MM (Shoulder) ×2 IMPLANT
SCREW LOCK PERIPHERAL 36MM (Screw) ×2 IMPLANT
SET PIN UNIVERSAL REVERSE (SET/KITS/TRAYS/PACK) ×2 IMPLANT
SLING ARM FOAM STRAP LRG (SOFTGOODS) ×2 IMPLANT
SPACER SHLD UNI REV 36 +6 (Shoulder) ×2 IMPLANT
SPONGE LAP 18X18 X RAY DECT (DISPOSABLE) ×2 IMPLANT
SPONGE LAP 4X18 X RAY DECT (DISPOSABLE) ×2 IMPLANT
STEM HUMERAL MOD SZ 5 135 DEG (Stem) ×2 IMPLANT
SUCTION FRAZIER HANDLE 10FR (MISCELLANEOUS) ×1
SUCTION TUBE FRAZIER 10FR DISP (MISCELLANEOUS) ×1 IMPLANT
SUT FIBERWIRE #2 38 T-5 BLUE (SUTURE) ×4
SUT MNCRL AB 3-0 PS2 18 (SUTURE) ×2 IMPLANT
SUT MON AB 2-0 CT1 27 (SUTURE) ×2 IMPLANT
SUT VIC AB 1 CT1 27 (SUTURE) ×1
SUT VIC AB 1 CT1 27XBRD ANBCTR (SUTURE) ×1 IMPLANT
SUTURE FIBERWR #2 38 T-5 BLUE (SUTURE) ×2 IMPLANT
SYR CONTROL 10ML LL (SYRINGE) IMPLANT
TOWEL OR 17X24 6PK STRL BLUE (TOWEL DISPOSABLE) ×2 IMPLANT
TOWEL OR 17X26 10 PK STRL BLUE (TOWEL DISPOSABLE) ×2 IMPLANT
WATER STERILE IRR 1000ML POUR (IV SOLUTION) ×2 IMPLANT

## 2017-04-12 NOTE — Anesthesia Postprocedure Evaluation (Signed)
Anesthesia Post Note  Patient: Nigel SloopBrenda Favata  Procedure(s) Performed: Procedure(s) (LRB): REVERSE SHOULDER ARTHROPLASTY (Left)     Patient location during evaluation: PACU Anesthesia Type: General Level of consciousness: awake and alert Pain management: pain level controlled Vital Signs Assessment: post-procedure vital signs reviewed and stable Respiratory status: spontaneous breathing, nonlabored ventilation, respiratory function stable and patient connected to nasal cannula oxygen Cardiovascular status: blood pressure returned to baseline and stable Postop Assessment: no signs of nausea or vomiting Anesthetic complications: no    Last Vitals:  Vitals:   04/12/17 1045 04/12/17 1102  BP:  127/61  Pulse: 85 84  Resp: 19 16  Temp:  36.8 C    Last Pain:  Vitals:   04/12/17 1102  TempSrc: Oral  PainSc: 2                  Kennieth RadFitzgerald, Danija Gosa E

## 2017-04-12 NOTE — Op Note (Signed)
04/12/2017  9:06 AM  PATIENT:   Nicole SloopBrenda Stephenson  71 y.o. female  PRE-OPERATIVE DIAGNOSIS:  Left shoulder rotator cuff arthropathy  POST-OPERATIVE DIAGNOSIS:  same  PROCEDURE:  L RSA #5.5 stem, +6 spacer, +3 poly, 36/+4 glenosphere  SURGEON:  Keirstan Iannello, Vania ReaKevin M. M.D.  ASSISTANTS: Shuford pac   ANESTHESIA:   GET + ISB  EBL: 150  SPECIMEN:  none  Drains: none   PATIENT DISPOSITION:  PACU - hemodynamically stable.    PLAN OF CARE: Admit for overnight observation  Dictation# [id number]   Contact # [phone]

## 2017-04-12 NOTE — H&P (Signed)
Nigel Sloop    Chief Complaint: Left shoulder rotator cuff arthropathy HPI: The patient is a 71 y.o. 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 Allergic rhinitis, seasonal   . Anemia   . Anxiety   . Arthritis   . Asthma    exercise induced  . Chronic kidney disease    Stage III  . Constipation   . Depression   . DJD (degenerative joint disease)   . DVT (deep venous thrombosis) (HCC) 1997  . Dyspnea    with exertion  . GERD (gastroesophageal reflux disease)   . 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 History of kidney stones    passed x 2  . Hypertension   . Pneumonia     Past Surgical History:  Procedure Laterality Date  . bottom of left foot for keloids - 1993    . COLONOSCOPY    . right foot surgery ,rod inserted in lateral side of foot    . TOTAL KNEE ARTHROPLASTY Left 02/08/2016   Procedure: LEFT TOTAL KNEE ARTHROPLASTY;  Surgeon: Durene Romans, MD;  Location: [facility];  Service: Orthopedics;  Laterality: Left;    History reviewed. No pertinent family history.  Social History:  reports that she has never smoked. She has never used smokeless tobacco. She reports that she does not drink alcohol or use drugs.   Medications Prior to Admission  Medication Sig Dispense Refill  . acetaminophen (TYLENOL) 500 MG tablet Take 1,000 mg by mouth every 8 (eight) hours as needed for moderate pain.    Marland Kitchen amLODipine (NORVASC) 10 MG tablet Take 10 mg by mouth daily.  3  . buPROPion (WELLBUTRIN SR) 150 MG 12 hr tablet Take 150 mg by mouth every morning.  2  . cetirizine (ZYRTEC) 10 MG tablet Take 10 mg by mouth every morning.    . cholecalciferol (VITAMIN D) 1000 units tablet Take 1,000 Units by mouth daily.    . DULoxetine (CYMBALTA) 30 MG capsule Take 30 mg by mouth daily.  11  . fluticasone (FLONASE) 50 MCG/ACT nasal spray Place 2 sprays into both nostrils 2 (two) times daily as needed for allergies or rhinitis.    .  hydrochlorothiazide (HYDRODIURIL) 25 MG tablet Take 25 mg by mouth every morning.    . Lactobacillus Acid-Pectin (ACIDOPHILUS/CITRUS PECTIN) TABS Take 2 tablets by mouth daily.    Marland Kitchen lisinopril (PRINIVIL,ZESTRIL) 20 MG tablet Take 20 mg by mouth 2 (two) times daily.    . meclizine (ANTIVERT) 25 MG tablet Take 25 mg by mouth every 6 (six) hours as needed for dizziness.    Marland Kitchen omeprazole (PRILOSEC OTC) 20 MG tablet Take 20 mg by mouth 2 (two) times daily.     . pantoprazole (PROTONIX) 40 MG tablet Take 40 mg by mouth 2 (two) times daily.    . polyethylene glycol (MIRALAX / GLYCOLAX) packet Take 17 g by mouth 2 (two) times daily. (Patient taking differently: Take 17 g by mouth daily. ) 14 each 0  . pravastatin (PRAVACHOL) 40 MG tablet Take 40 mg by mouth every morning.    . traZODone (DESYREL) 50 MG tablet Take 50 mg by mouth at bedtime. Sleep.   3  . anti-nausea (EMETROL) solution Take 30 mLs by mouth daily as needed for nausea or vomiting (acid reflux).    . celecoxib (CELEBREX) 200 MG capsule Take 1 capsule (200 mg total) by mouth every 12 (twelve) hours. (Patient  not taking: Reported on 04/04/2017) 60 capsule 0  . docusate sodium (COLACE) 100 MG capsule Take 1 capsule (100 mg total) by mouth 2 (two) times daily. (Patient not taking: Reported on 04/04/2017) 10 capsule 0  . ferrous sulfate 325 (65 FE) MG tablet Take 1 tablet (325 mg total) by mouth 3 (three) times daily after meals. (Patient not taking: Reported on 04/04/2017)  3  . HYDROcodone-acetaminophen (NORCO) 7.5-325 MG tablet Take 1-2 tablets by mouth every 4 (four) hours as needed for moderate pain. (Patient not taking: Reported on 04/04/2017) 100 tablet 0  . rivaroxaban (XARELTO) 10 MG TABS tablet Take 1 tablet (10 mg total) by mouth daily. 14 tablet 0  . tiZANidine (ZANAFLEX) 4 MG tablet Take 1 tablet (4 mg total) by mouth every 6 (six) hours as needed for muscle spasms. (Patient not taking: Reported on 04/04/2017) 40 tablet 0  . valACYclovir  (VALTREX) 500 MG tablet TAKE 1 TABLET BY MOUTH TWICE DAILY  FOR 5 DAYS FOR OUTBREAKS  2     Physical Exam: left shoulder with painful and severely restricted motion as noted at recent office visits  Vitals  Temp:  [98.3 F (36.8 C)-98.4 F (36.9 C)] 98.4 F (36.9 C) (06/28 [id number]) Resp:  [20] 20 (06/28 0611) BP: (111)/(72) 111/72 (06/28 0611) SpO2:  [100 %] 100 % (06/28 0611) Weight:  [86.6 kg (191 lb)] 86.6 kg (191 lb) (06/28 [id number])  Assessment/Plan  Impression: Left shoulder rotator cuff arthropathy  Plan of Action: Procedure(s): REVERSE SHOULDER ARTHROPLASTY  Auset Fritzler M Weylin Plagge 04/12/2017, 6:26 AM Contact # [phone]

## 2017-04-12 NOTE — Transfer of Care (Signed)
Immediate Anesthesia Transfer of Care Note  Patient: Nicole SloopBrenda Stephenson  Procedure(s) Performed: Procedure(s): REVERSE SHOULDER ARTHROPLASTY (Left)  Patient Location: PACU  Anesthesia Type:GA combined with regional for post-op pain  Level of Consciousness: awake, alert  and oriented  Airway & Oxygen Therapy: Patient Spontanous Breathing and Patient connected to nasal cannula oxygen  Post-op Assessment: Report given to RN, Post -op Vital signs reviewed and stable and Patient moving all extremities  Post vital signs: Reviewed and stable  Last Vitals:  Vitals:   04/12/17 0614 04/12/17 0627  BP:    Pulse:  79  Resp:    Temp: 36.9 C     Last Pain:  Vitals:   04/12/17 0611  TempSrc: Oral      Patients Stated Pain Goal: 5 (04/12/17 [id number])  Complications: No apparent anesthesia complications

## 2017-04-12 NOTE — Anesthesia Procedure Notes (Signed)
Procedure Name: Intubation Date/Time: 04/12/2017 7:33 AM Performed by: Kyung Rudd Pre-anesthesia Checklist: Patient identified, Emergency Drugs available, Suction available and Patient being monitored Patient Re-evaluated:Patient Re-evaluated prior to inductionOxygen Delivery Method: Circle system utilized Preoxygenation: Pre-oxygenation with 100% oxygen Intubation Type: IV induction Ventilation: Mask ventilation without difficulty Laryngoscope Size: Mac and 3 Grade View: Grade I Tube type: Oral Tube size: 7.0 mm Number of attempts: 1 Airway Equipment and Method: Stylet Placement Confirmation: ETT inserted through vocal cords under direct vision,  positive ETCO2 and breath sounds checked- equal and bilateral Secured at: 21 cm Tube secured with: Tape Dental Injury: Teeth and Oropharynx as per pre-operative assessment

## 2017-04-12 NOTE — Anesthesia Procedure Notes (Signed)
Anesthesia Regional Block: Interscalene brachial plexus block   Pre-Anesthetic Checklist: ,, timeout performed, Correct Patient, Correct Site, Correct Laterality, Correct Procedure, Correct Position, site marked, Risks and benefits discussed,  Surgical consent,  Pre-op evaluation,  At surgeon's request and post-op pain management  Laterality: Left  Prep: chloraprep       Needles:  Injection technique: Single-shot  Needle Type: Echogenic Stimulator Needle     Needle Length: 9cm  Needle Gauge: 21     Additional Needles:   Procedures: ultrasound guided, nerve stimulator,,,,,,   Nerve Stimulator or Paresthesia:  Response: Deltoid, 0.5 mA,   Additional Responses:   Narrative:  Start time: 04/12/2017 7:10 AM End time: 04/12/2017 7:16 AM Injection made incrementally with aspirations every 5 mL.  Performed by: Personally  Anesthesiologist: Marcene DuosFITZGERALD, Sarra Rachels

## 2017-04-12 NOTE — Discharge Instructions (Signed)

## 2017-04-12 NOTE — Op Note (Signed)
Nicole Stephenson, Nicole Stephenson               ACCOUNT NO.:  [id number]  MEDICAL RECORD NO.:  [id number]  LOCATION:  [facility]                         FACILITY:  [facility]  PHYSICIAN:  Vania Rea. Cheresa Siers, M.D.  DATE OF BIRTH:  November 02, 1945  DATE OF PROCEDURE:  04/12/2017 DATE OF DISCHARGE:                              OPERATIVE REPORT   PREOPERATIVE DIAGNOSIS:  End-stage left shoulder rotator cuff tear arthropathy.  POSTOPERATIVE DIAGNOSIS:  End-stage left shoulder rotator cuff tear arthropathy.  PROCEDURE:  Left reverse shoulder arthroplasty utilizing a press-fit size 5.5 Arthrex stem, posterior offset metaphysis, a +6 spacer, +3 poly, 36 +4 glenosphere and a small baseplate.  SURGEON:  Vania Rea. Nihal Doan, M.D.  Threasa HeadsFrench Ana A. Shuford, PA-C.  ANESTHESIA:  General endotracheal as well as an interscalene block.  ESTIMATED BLOOD LOSS:  150 mL.  DRAINS:  None.  HISTORY:  Nicole Stephenson is a 71 year old female, who has had chronic and progressive increasing left shoulder pain related to rotator cuff tear arthropathy.  Plain radiographs show a high-riding humeral head with erosion of the undersurface of the acromion.  She has profound loss of motion.  She is brought to the operating room at this time for planned left reverse shoulder arthroplasty.  Preoperatively, I counseled Nicole Stephenson regarding treatment options and potential risks versus benefits thereof.  Possible surgical complications were reviewed including bleeding, infection, neurovascular injury, persistent pain, loss of motion, anesthetic complication, failure of the implant, and possible need for additional surgery.  She understands and accepts and agrees with our planned procedure.  PROCEDURE IN DETAIL:  After undergoing routine preop evaluation, the patient received prophylactic antibiotics and an interscalene block was established in the holding area by the Anesthesia Department.  Placed supine on the operating table.  Underwent smooth  induction of a general endotracheal anesthesia.  Placed in a beach chair position and appropriately padded and protected.  Left shoulder girdle region was sterilely prepped and draped in standard fashion.  Time-out was called. An anterior deltopectoral approach of the left shoulder was made through an approximately 10-cm incision.  Skin flaps were elevated.  Dissection was carried deeply.  Electrocautery was used for hemostasis.  The cephalic vein was taken laterally with the deltoid.  Deltopectoral interval was then developed proximal and distal.  The upper centimeter of the pectoralis major was tenotomized to enhance exposure.  Conjoined tendon was mobilized and retracted medially.  At this point, a large amount of synovial fluid within the very thin bursal layer was evacuated and bursa was excised and there was complete loss of the rotator cuff superiorly and essentially no remnant of the subscapularis and marked high riding of the humeral head with anterior superior escape.  Biceps previously ruptured.  We excised the majority of the bursal sac tissue. Remnant of the subscapularis was divided.  Capsular attachments on the anterioinferior and inferior aspects of the humeral neck were then divided and this allowed delivery of the head through the wound.  Using our extramedullary guide, we outlined our humeral head resection which we completed with an oscillating saw, maintaining the native retroversion approximately 20 degrees.  Metal cap was placed over the cut proximal humeral surface.  At  this point, we then exposed the glenoid with a combination of Fukuda, pitchfork, and snake tongue retractors.  I performed a circumferential labral resection.  I placed a guidepin into the center of the glenoid.  We then reamed this with our +2 central reamer.  Peripheral reamer was then used to size small and then repeated this once again with a size 2 for some extra depth and then the peripheral  reamer was utilized and this gave us an excellent bony bed for placement of our small glenoid base plate.  The base plate was then impacted and was transfixed with a central lag screw and inferior and superior locking screws, all of which obtained excellent bony fit and fixation.  The base plate was irrigated.  The 36 +4 glenosphere was then impacted with excellent stability achieved.  At this point, we then returned our attention to the proximal humerus where we prepared the canal, hand reaming up to size 7.  We then broached in. Given the dimensions of the canal and the humeral metaphyseal region, the 5.5 broach gave us excellent proximal fill, fit, and fixation.  We then selected the posterior offset.  We reamed the metaphysis of the proximal humerus.  We then performed a trial reduction and found excellent soft tissue balance.  Our final implant was then assembled with 5.5 stem on the posterior offset metaphysis.  This was assembled and impacted with excellent press-fit interference fixation.  I then performed a series of trial reductions and ultimately a total of +9 off the stem gave us excellent soft tissue balance.  At this point, we then added the +6 spacer, then a +3 poly.  Final reduction was performed, which showed excellent soft tissue balance, excellent shoulder motion, and good stability.  The wounds were then copiously irrigated. Hemostasis was obtained.  At this point, the deltopectoral interval was then reapproximated with a series of interrupted figure-of-eight #1 Vicryl sutures.  2-0 Monocryl was used for the subcu layer, intracuticular 3-0 Monocryl for the skin followed by Dermabond and Aquacel dressing.  Left arm was placed in a sling.  The patient was awakened, extubated, and taken to the recovery room in stable condition.  [name], PAC, was used as an Geophysicist/field seismologistassistant throughout this case, was essential for help with positioning the patient, positioning  the extremity, tissue manipulation, implantation of prosthesis, wound closure, and intraoperative decision making.     Vania ReaKevin M. Natasha Burda, M.D.     KMS/MEDQ  D:  04/12/2017  T:  04/12/2017  Job:  [id number]

## 2017-04-13 ENCOUNTER — Encounter (HOSPITAL_COMMUNITY): Payer: Self-pay | Admitting: Orthopedic Surgery

## 2017-04-13 MED ORDER — DIAZEPAM 5 MG PO TABS: 2.5000 mg | ORAL_TABLET | Freq: Four times a day (QID) | ORAL | 1 refills | Status: DC | PRN

## 2017-04-13 MED ORDER — ONDANSETRON HCL 4 MG PO TABS: 4.0000 mg | ORAL_TABLET | Freq: Three times a day (TID) | ORAL | 0 refills | Status: DC | PRN

## 2017-04-13 MED ORDER — OXYCODONE-ACETAMINOPHEN 5-325 MG PO TABS: 1.0000 | ORAL_TABLET | ORAL | 0 refills | Status: DC | PRN

## 2017-04-13 NOTE — Evaluation (Signed)
Occupational Therapy Evaluation and Discharge Patient Details Name: Nicole SloopBrenda Stephenson MRN: [id number] DOB: 07/20/1946 Today's Date: 04/13/2017    History of Present Illness  Left reverse shoulder arthroplasty    Clinical Impression   This 71 yo female admitted and underwent above presents to acute OT with all education completed, we will D/C from acute OT.    Follow Up Recommendations  DC plan and follow up therapy as arranged by surgeon;Supervision/Assistance - 24 hour    Equipment Recommendations  None recommended by OT        Precautions / Restrictions Precautions Precautions: Shoulder Shoulder Interventions: Shoulder sling/immobilizer;Off for dressing/bathing/exercises (can have off in controlled environment, sleep in sling) Precaution Comments: PROM/AROM shoulder flexion to 90 degrees, PROM/AROM shoulder abduction to 60 degrees, no internal rotation exercises, elbow-wrist-hand exercises yes Restrictions Weight Bearing Restrictions: Yes LUE Weight Bearing: Non weight bearing      Mobility Bed Mobility Overal bed mobility: Modified Independent             General bed mobility comments: HOB up and us of rail. Pt is going to sleep in lift chair at home  Transfers Overall transfer level: Needs assistance Equipment used: 1 person hand held assist Transfers: Sit to/from Stand Sit to Stand: Min assist         General transfer comment: Pt reported to me that pta her balance was not good, that she "furniture walks" in the house and somtimes uses a cane    Balance Overall balance assessment: Needs assistance Sitting-balance support: No upper extremity supported;Feet supported Sitting balance-Leahy Scale: Good     Standing balance support: Single extremity supported;During functional activity Standing balance-Leahy Scale: Poor                             ADL either performed or assessed with clinical judgement          Vision Patient Visual Report:  No change from baseline                Pertinent Vitals/Pain Pain Assessment: 0-10 Pain Score: 3  Pain Location: shoulder Pain Descriptors / Indicators: Sore Pain Intervention(s): Limited activity within patient's tolerance;Repositioned;Ice applied     Hand Dominance Right   Extremity/Trunk Assessment Upper Extremity Assessment Upper Extremity Assessment: RUE deficits/detail;LUE deficits/detail RUE Deficits / Details: decreased shoulder AROM RUE Coordination: decreased gross motor LUE Deficits / Details: shoulder sx this admission; rest of joints WNL LUE Coordination: decreased gross motor           Communication Communication Communication: No difficulties   Cognition Arousal/Alertness: Awake/alert Behavior During Therapy: WFL for tasks assessed/performed Overall Cognitive Status: Within Functional Limits for tasks assessed                                         Exercises Other Exercises Other Exercises: Pt able to return demonstrate shoulder flexion (to ~20 degrees), shoulder abduction (to ~20 degrees), can do lap slides, elbow-wrist-hand-forearm exercises, and is aware she can use her arm functionally for basic ADLs as long as does not pull, push, or lift with LUE   Shoulder Instructions Shoulder Instructions Donning/doffing shirt without moving shoulder: Patient able to independently direct caregiver Method for sponge bathing under operated UE: Patient able to independently direct caregiver Donning/doffing sling/immobilizer: Patient able to independently direct caregiver Correct positioning of sling/immobilizer: Patient able to independently  direct caregiver Pendulum exercises (written home exercise program):  (NA) ROM for elbow, wrist and digits of operated UE: Independent Sling wearing schedule (on at all times/off for ADL's):  (verbalizes understanding) Proper positioning of operated UE when showering:  (verbalizes understanding) Dressing  change:  (NA) Positioning of UE while sleeping:  (verbalizes understanding)    Home Living Family/patient expects to be discharged to:: Private residence Living Arrangements: Children;Other relatives Available Help at Discharge: Family;Available 24 hours/day Type of Home: House Home Access: Stairs to enter [employer] of Steps: 5 Entrance Stairs-Rails: Right;Left;Can reach both Home Layout: One level     Bathroom Shower/Tub: Walk-in Pensions consultant: Handicapped height     Home Equipment: Bedside commode;Hand held shower head;Grab bars - tub/shower;Other (comment) (lift chair)          Prior Functioning/Environment Level of Independence: Independent                 OT Problem List: Decreased strength;Decreased range of motion;Pain;Impaired balance (sitting and/or standing);Impaired UE functional use          OT Goals(Current goals can be found in the care plan section) Acute Rehab OT Goals Patient Stated Goal: home today  OT Frequency:                  AM-PAC PT "6 Clicks" Daily Activity     Outcome Measure Help from another person eating meals?: None Help from another person taking care of personal grooming?: None Help from another person toileting, which includes using toliet, bedpan, or urinal?: A Little Help from another person bathing (including washing, rinsing, drying)?: A Little Help from another person to put on and taking off regular upper body clothing?: A Lot Help from another person to put on and taking off regular lower body clothing?: A Lot 6 Click Score: 18   End of Session    Activity Tolerance: Patient tolerated treatment well Patient left: in chair;with call bell/phone within reach  OT Visit Diagnosis: Unsteadiness on feet (R26.81);Muscle weakness (generalized) (M62.81);Pain Pain - Right/Left: Left Pain - part of body: Shoulder                Time: 4098-1191 OT Time Calculation (min): 47 min Charges:   OT General Charges $OT Visit: 1 Procedure OT Evaluation $OT Eval Moderate Complexity: 1 Procedure OT Treatments $Self Care/Home Management : 8-22 mins $Therapeutic Exercise: 8-22 mins  Ignacia Palma, OTR/L [phone] 04/13/2017

## 2017-04-13 NOTE — Progress Notes (Signed)
Pt discharged home with granddaughter. AVS and scripts given to patient. All belongings sent with patient. All questions answered and pt verbalized understanding to discharge instructions. Blood pressure (!) 115/55, pulse 75, temperature 98.7 F (37.1 C), temperature source Oral, resp. rate 17, weight 86.6 kg (191 lb), SpO2 100 %.

## 2017-04-13 NOTE — Discharge Summary (Signed)
PATIENT ID:      Nicole Stephenson  MRN:     [id number] DOB/AGE:    1946/07/24 / 71 y.o.     DISCHARGE SUMMARY  ADMISSION DATE:    04/12/2017 DISCHARGE DATE:    ADMISSION DIAGNOSIS: Left shoulder rotator cuff arthropathy Past Medical History:  Diagnosis Date  . Allergic rhinitis, seasonal   . Anemia   . Anxiety   . Arthritis   . Asthma    exercise induced  . Chronic kidney disease    Stage III  . Constipation   . Depression   . DJD (degenerative joint disease)   . DVT (deep venous thrombosis) (HCC) 1997  . Dyspnea    with exertion  . GERD (gastroesophageal reflux disease)   . 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 History of kidney stones    passed x 2  . Hypertension   . Pneumonia     DISCHARGE DIAGNOSIS:   Active Problems:   S/p reverse total shoulder arthroplasty   PROCEDURE: Procedure(s): REVERSE SHOULDER ARTHROPLASTY on 04/12/2017  CONSULTS:    HISTORY:  See H&P in chart.  HOSPITAL COURSE:  Nicole Stephenson is a 71 y.o. admitted on 04/12/2017 with a diagnosis of Left shoulder rotator cuff arthropathy.  They were brought to the operating room on 04/12/2017 and underwent Procedure(s): REVERSE SHOULDER ARTHROPLASTY.    They were given perioperative antibiotics: Anti-infectives    Start     Dose/Rate Route Frequency Ordered Stop   04/12/17 1130  ceFAZolin (ANCEF) IVPB 2g/100 mL premix     2 g 200 mL/hr over 30 Minutes Intravenous Every 6 hours 04/12/17 0946 04/13/17 0031   04/12/17 0720  ceFAZolin (ANCEF) 2-4 GM/100ML-% IVPB    Comments:  Forte, Lindsi   : cabinet override      04/12/17 0720 04/12/17 0730    .  Patient underwent the above named procedure and tolerated it well. The following day they were hemodynamically stable and pain was controlled on oral analgesics. They were neurovascularly intact to the operative extremity. OT was ordered and worked with patient per protocol. They were medically and orthopaedically stable for discharge on day 1.    DIAGNOSTIC  STUDIES:  RECENT RADIOGRAPHIC STUDIES :  No results found.  RECENT VITAL SIGNS:  Patient Vitals for the past 24 hrs:  BP Temp Temp src Pulse Resp SpO2  04/13/17 0629 (!) 120/51 98.8 F (37.1 C) Oral 71 18 100 %  04/12/17 2012 (!) 105/52 98.9 F (37.2 C) Oral 79 18 100 %  04/12/17 1500 (!) 110/49 98.6 F (37 C) Oral 80 - 100 %  04/12/17 1431 (!) 112/48 97.9 F (36.6 C) Oral 81 - 99 %  04/12/17 1102 127/61 98.2 F (36.8 C) Oral 84 16 98 %  04/12/17 1045 - - - 85 19 96 %  04/12/17 1033 (!) 143/71 - - - - -  04/12/17 1019 (!) 142/75 97.5 F (36.4 C) - 81 16 97 %  04/12/17 1015 - - - 82 15 97 %  04/12/17 1000 138/74 - - 85 16 97 %  04/12/17 0945 (!) 156/72 - - 84 (!) 23 100 %  04/12/17 0930 (!) 155/78 - - 83 18 100 %  04/12/17 0915 (!) 158/73 98 F (36.7 C) - - 17 100 %  .  RECENT EKG RESULTS:    Orders placed or performed during the hospital encounter of 04/05/17  . EKG 12 lead  . EKG 12 lead    DISCHARGE INSTRUCTIONS:  DISCHARGE MEDICATIONS:   Allergies as of 04/13/2017      Reactions   Penicillins Itching, Swelling, Other (See Comments)   > > > PATIENT HAS RECEIVED ANCEF MULTIPLE TIMES WITHOUT REACTION < < <     OK TO GIVE ANCEF, IF ORDERED, PER MD.  04/12/17 Arm swelling PATIENT HAD A PCN REACTION WITH IMMEDIATE RASH, FACIAL/TONGUE/THROAT SWELLING, SOB, OR LIGHTHEADEDNESS WITH HYPOTENSION:  #  #  #  YES  #  #  #   Has patient had a PCN reaction causing severe rash involving mucus membranes or skin necrosis: UNSPECIFIED  Has patient had a PCN reaction that required hospitalization No      Medication List    STOP taking these medications   celecoxib 200 MG capsule Commonly known as:  CELEBREX   rivaroxaban 10 MG Tabs tablet Commonly known as:  XARELTO   tiZANidine 4 MG tablet Commonly known as:  ZANAFLEX     TAKE these medications   acetaminophen 500 MG tablet Commonly known as:  TYLENOL Take 1,000 mg by mouth every 8 (eight) hours as needed for moderate  pain.   ACIDOPHILUS/CITRUS PECTIN Tabs Take 2 tablets by mouth daily.   amLODipine 10 MG tablet Commonly known as:  NORVASC Take 10 mg by mouth daily.   anti-nausea solution Take 30 mLs by mouth daily as needed for nausea or vomiting (acid reflux).   buPROPion 150 MG 12 hr tablet Commonly known as:  WELLBUTRIN SR Take 150 mg by mouth every morning.   cetirizine 10 MG tablet Commonly known as:  ZYRTEC Take 10 mg by mouth every morning.   cholecalciferol 1000 units tablet Commonly known as:  VITAMIN D Take 1,000 Units by mouth daily.   diazepam 5 MG tablet Commonly known as:  VALIUM Take 0.5-1 tablets (2.5-5 mg total) by mouth every 6 (six) hours as needed for muscle spasms or sedation.   docusate sodium 100 MG capsule Commonly known as:  COLACE Take 1 capsule (100 mg total) by mouth 2 (two) times daily.   DULoxetine 30 MG capsule Commonly known as:  CYMBALTA Take 30 mg by mouth daily.   ferrous sulfate 325 (65 FE) MG tablet Take 1 tablet (325 mg total) by mouth 3 (three) times daily after meals.   fluticasone 50 MCG/ACT nasal spray Commonly known as:  FLONASE Place 2 sprays into both nostrils 2 (two) times daily as needed for allergies or rhinitis.   hydrochlorothiazide 25 MG tablet Commonly known as:  HYDRODIURIL Take 25 mg by mouth every morning.   HYDROcodone-acetaminophen 7.5-325 MG tablet Commonly known as:  NORCO Take 1-2 tablets by mouth every 4 (four) hours as needed for moderate pain.   lisinopril 20 MG tablet Commonly known as:  PRINIVIL,ZESTRIL Take 20 mg by mouth 2 (two) times daily.   meclizine 25 MG tablet Commonly known as:  ANTIVERT Take 25 mg by mouth every 6 (six) hours as needed for dizziness.   omeprazole 20 MG tablet Commonly known as:  PRILOSEC OTC Take 20 mg by mouth 2 (two) times daily.   ondansetron 4 MG tablet Commonly known as:  ZOFRAN Take 1 tablet (4 mg total) by mouth every 8 (eight) hours as needed for nausea or  vomiting.   oxyCODONE-acetaminophen 5-325 MG tablet Commonly known as:  PERCOCET Take 1-2 tablets by mouth every 4 (four) hours as needed.   pantoprazole 40 MG tablet Commonly known as:  PROTONIX Take 40 mg by mouth 2 (two) times daily.  polyethylene glycol packet Commonly known as:  MIRALAX / GLYCOLAX Take 17 g by mouth 2 (two) times daily. What changed:  when to take this   pravastatin 40 MG tablet Commonly known as:  PRAVACHOL Take 40 mg by mouth every morning.   traZODone 50 MG tablet Commonly known as:  DESYREL Take 50 mg by mouth at bedtime. Sleep.   valACYclovir 500 MG tablet Commonly known as:  VALTREX TAKE 1 TABLET BY MOUTH TWICE DAILY  FOR 5 DAYS FOR OUTBREAKS       FOLLOW UP VISIT:   Follow-up Information    Francena Hanly, MD.   Specialty:  Orthopedic Surgery Why:  call to be seen in 10-14 days Contact information: [address] [place] [address] [phone]           DISCHARGE TO: Home  DISPOSITION: Good  DISCHARGE CONDITION:  Rodolph Bong for Dr. Francena Hanly 04/13/2017, 8:05 AM

## 2017-04-25 DIAGNOSIS — Z96652 Presence of left artificial knee joint: Secondary | ICD-10-CM | POA: Diagnosis not present

## 2017-04-25 DIAGNOSIS — Z634 Disappearance and death of family member: Secondary | ICD-10-CM | POA: Diagnosis not present

## 2017-04-25 DIAGNOSIS — J323 Chronic sphenoidal sinusitis: Secondary | ICD-10-CM | POA: Diagnosis not present

## 2017-04-25 DIAGNOSIS — R55 Syncope and collapse: Secondary | ICD-10-CM | POA: Diagnosis not present

## 2017-04-25 DIAGNOSIS — Z96612 Presence of left artificial shoulder joint: Secondary | ICD-10-CM | POA: Diagnosis not present

## 2017-04-25 DIAGNOSIS — D649 Anemia, unspecified: Secondary | ICD-10-CM | POA: Diagnosis not present

## 2017-04-25 DIAGNOSIS — Z598 Other problems related to housing and economic circumstances: Secondary | ICD-10-CM | POA: Diagnosis not present

## 2017-04-25 DIAGNOSIS — G319 Degenerative disease of nervous system, unspecified: Secondary | ICD-10-CM | POA: Diagnosis not present

## 2017-04-25 DIAGNOSIS — I959 Hypotension, unspecified: Secondary | ICD-10-CM | POA: Diagnosis not present

## 2017-04-25 DIAGNOSIS — Z471 Aftercare following joint replacement surgery: Secondary | ICD-10-CM | POA: Diagnosis not present

## 2017-04-25 DIAGNOSIS — Z880 Allergy status to penicillin: Secondary | ICD-10-CM | POA: Diagnosis not present

## 2017-04-25 DIAGNOSIS — J705 Respiratory conditions due to smoke inhalation: Secondary | ICD-10-CM | POA: Diagnosis not present

## 2017-05-02 ENCOUNTER — Encounter (HOSPITAL_COMMUNITY): Payer: Self-pay | Admitting: *Deleted

## 2017-05-02 DIAGNOSIS — Z96612 Presence of left artificial shoulder joint: Secondary | ICD-10-CM | POA: Diagnosis not present

## 2017-05-02 DIAGNOSIS — Z471 Aftercare following joint replacement surgery: Secondary | ICD-10-CM | POA: Diagnosis not present

## 2017-05-02 NOTE — Progress Notes (Signed)
Spoke with pt for pre-op call. Pt had surgery on 04/12/17. Pt states her allergies, medications, medical and surgical history are the same.

## 2017-05-03 ENCOUNTER — Encounter (HOSPITAL_COMMUNITY)
Admission: RE | Disposition: A | Discharge status: Home / Self Care | Payer: Self-pay | Source: Ambulatory Visit | Attending: Orthopedic Surgery

## 2017-05-03 ENCOUNTER — Inpatient Hospital Stay (HOSPITAL_COMMUNITY): Payer: Medicare Other | Admitting: Anesthesiology

## 2017-05-03 ENCOUNTER — Ambulatory Visit (HOSPITAL_COMMUNITY)
Admission: RE | Admit: 2017-05-03 | Discharge: 2017-05-03 | Disposition: A | Discharge status: Home / Self Care | Payer: Medicare Other | Source: Ambulatory Visit | Attending: Orthopedic Surgery | Admitting: Orthopedic Surgery

## 2017-05-03 ENCOUNTER — Ambulatory Visit (HOSPITAL_COMMUNITY): Payer: Medicare Other

## 2017-05-03 ENCOUNTER — Encounter (HOSPITAL_COMMUNITY): Payer: Self-pay | Admitting: *Deleted

## 2017-05-03 DIAGNOSIS — N183 Chronic kidney disease, stage 3 (moderate): Secondary | ICD-10-CM | POA: Insufficient documentation

## 2017-05-03 DIAGNOSIS — I10 Essential (primary) hypertension: Secondary | ICD-10-CM | POA: Insufficient documentation

## 2017-05-03 DIAGNOSIS — F419 Anxiety disorder, unspecified: Secondary | ICD-10-CM | POA: Insufficient documentation

## 2017-05-03 DIAGNOSIS — Z79899 Other long term (current) drug therapy: Secondary | ICD-10-CM | POA: Insufficient documentation

## 2017-05-03 DIAGNOSIS — J45909 Unspecified asthma, uncomplicated: Secondary | ICD-10-CM | POA: Insufficient documentation

## 2017-05-03 DIAGNOSIS — R296 Repeated falls: Secondary | ICD-10-CM | POA: Diagnosis not present

## 2017-05-03 DIAGNOSIS — D649 Anemia, unspecified: Secondary | ICD-10-CM | POA: Insufficient documentation

## 2017-05-03 DIAGNOSIS — M1990 Unspecified osteoarthritis, unspecified site: Secondary | ICD-10-CM | POA: Diagnosis not present

## 2017-05-03 DIAGNOSIS — F418 Other specified anxiety disorders: Secondary | ICD-10-CM | POA: Diagnosis not present

## 2017-05-03 DIAGNOSIS — S43005A Unspecified dislocation of left shoulder joint, initial encounter: Secondary | ICD-10-CM | POA: Insufficient documentation

## 2017-05-03 DIAGNOSIS — F329 Major depressive disorder, single episode, unspecified: Secondary | ICD-10-CM | POA: Insufficient documentation

## 2017-05-03 DIAGNOSIS — S43085A Other dislocation of left shoulder joint, initial encounter: Secondary | ICD-10-CM | POA: Diagnosis not present

## 2017-05-03 DIAGNOSIS — T84028A Dislocation of other internal joint prosthesis, initial encounter: Secondary | ICD-10-CM | POA: Diagnosis not present

## 2017-05-03 DIAGNOSIS — Z96612 Presence of left artificial shoulder joint: Secondary | ICD-10-CM | POA: Diagnosis not present

## 2017-05-03 DIAGNOSIS — K219 Gastro-esophageal reflux disease without esophagitis: Secondary | ICD-10-CM | POA: Diagnosis not present

## 2017-05-03 DIAGNOSIS — Z419 Encounter for procedure for purposes other than remedying health state, unspecified: Secondary | ICD-10-CM

## 2017-05-03 DIAGNOSIS — E860 Dehydration: Secondary | ICD-10-CM | POA: Diagnosis not present

## 2017-05-03 HISTORY — PX: SHOULDER CLOSED REDUCTION: SHX1051

## 2017-05-03 LAB — CBC
HCT: 31.7 % — ABNORMAL LOW (ref 36.0–46.0)
Hemoglobin: 10.3 g/dL — ABNORMAL LOW (ref 12.0–15.0)
MCH: 28.8 pg (ref 26.0–34.0)
MCHC: 32.5 g/dL (ref 30.0–36.0)
MCV: 88.5 fL (ref 78.0–100.0)
PLATELETS: 449 10*3/uL — AB (ref 150–400)
RBC: 3.58 MIL/uL — AB (ref 3.87–5.11)
RDW: 14 % (ref 11.5–15.5)
WBC: 6.5 10*3/uL (ref 4.0–10.5)

## 2017-05-03 LAB — BASIC METABOLIC PANEL
Anion gap: 11 (ref 5–15)
BUN: 14 mg/dL (ref 6–20)
CALCIUM: 9.9 mg/dL (ref 8.9–10.3)
CHLORIDE: 95 mmol/L — AB (ref 101–111)
CO2: 22 mmol/L (ref 22–32)
CREATININE: 1.21 mg/dL — AB (ref 0.44–1.00)
GFR calc non Af Amer: 44 mL/min — ABNORMAL LOW (ref >60)
GFR, EST AFRICAN AMERICAN: 51 mL/min — AB (ref >60)
Glucose, Bld: 101 mg/dL — ABNORMAL HIGH (ref 65–99)
Potassium: 5.2 mmol/L — ABNORMAL HIGH (ref 3.5–5.1)
Sodium: 128 mmol/L — ABNORMAL LOW (ref 135–145)

## 2017-05-03 LAB — GLUCOSE, CAPILLARY
GLUCOSE-CAPILLARY: 108 mg/dL — AB (ref 65–99)
Glucose-Capillary: 137 mg/dL — ABNORMAL HIGH (ref 65–99)

## 2017-05-03 SURGERY — CLOSED REDUCTION, SHOULDER
Anesthesia: Monitor Anesthesia Care | Laterality: Left

## 2017-05-03 MED ORDER — PROPOFOL 10 MG/ML IV BOLUS
INTRAVENOUS | Status: AC
  Filled 2017-05-03: qty 20

## 2017-05-03 MED ORDER — LIDOCAINE 2% (20 MG/ML) 5 ML SYRINGE
INTRAMUSCULAR | Status: DC | PRN
  Administered 2017-05-03: 20 mg via INTRAVENOUS

## 2017-05-03 MED ORDER — PROPOFOL 10 MG/ML IV BOLUS
INTRAVENOUS | Status: DC | PRN
  Administered 2017-05-03: 40 mg via INTRAVENOUS
  Administered 2017-05-03 (×2): 10 mg via INTRAVENOUS

## 2017-05-03 MED ORDER — MIDAZOLAM HCL 5 MG/5ML IJ SOLN
INTRAMUSCULAR | Status: DC | PRN
  Administered 2017-05-03: 1 mg via INTRAVENOUS

## 2017-05-03 MED ORDER — CHLORHEXIDINE GLUCONATE 4 % EX LIQD: 60.0000 mL | Freq: Once | CUTANEOUS | Status: DC

## 2017-05-03 MED ORDER — LACTATED RINGERS IV SOLN
INTRAVENOUS | Status: DC | PRN
  Administered 2017-05-03: 15:00:00 via INTRAVENOUS

## 2017-05-03 MED ORDER — PROMETHAZINE HCL 25 MG/ML IJ SOLN: 6.2500 mg | INTRAMUSCULAR | Status: DC | PRN

## 2017-05-03 MED ORDER — MIDAZOLAM HCL 2 MG/2ML IJ SOLN
INTRAMUSCULAR | Status: AC
  Filled 2017-05-03: qty 2

## 2017-05-03 MED ORDER — HYDROCODONE-ACETAMINOPHEN 5-325 MG PO TABS: 1.0000 | ORAL_TABLET | Freq: Four times a day (QID) | ORAL | 0 refills | Status: DC | PRN

## 2017-05-03 MED ORDER — FENTANYL CITRATE (PF) 250 MCG/5ML IJ SOLN
INTRAMUSCULAR | Status: AC
  Filled 2017-05-03: qty 5

## 2017-05-03 MED ORDER — HYDROMORPHONE HCL 1 MG/ML IJ SOLN: 0.2500 mg | INTRAMUSCULAR | Status: DC | PRN

## 2017-05-03 MED ORDER — CEFAZOLIN SODIUM-DEXTROSE 2-4 GM/100ML-% IV SOLN: 2.0000 g | INTRAVENOUS | Status: DC

## 2017-05-03 MED ORDER — MIDAZOLAM HCL 2 MG/2ML IJ SOLN
INTRAMUSCULAR | Status: AC
  Administered 2017-05-03: 2 mg
  Filled 2017-05-03: qty 2

## 2017-05-03 MED ORDER — FENTANYL CITRATE (PF) 100 MCG/2ML IJ SOLN
INTRAMUSCULAR | Status: AC
  Administered 2017-05-03: 100 ug
  Filled 2017-05-03: qty 2

## 2017-05-03 MED ORDER — FENTANYL CITRATE (PF) 100 MCG/2ML IJ SOLN
INTRAMUSCULAR | Status: DC | PRN
  Administered 2017-05-03: 50 ug via INTRAVENOUS

## 2017-05-03 NOTE — Anesthesia Postprocedure Evaluation (Signed)
Anesthesia Post Note  Patient: Nicole Stephenson  Procedure(s) Performed: Procedure(s) (LRB): Closed reduction left shoulder  (Left)     Patient location during evaluation: PACU Anesthesia Type: MAC Level of consciousness: awake, awake and alert and oriented Pain management: pain level controlled Vital Signs Assessment: post-procedure vital signs reviewed and stable Respiratory status: spontaneous breathing, nonlabored ventilation and respiratory function stable Anesthetic complications: no    Last Vitals:  Vitals:   05/03/17 1608 05/03/17 1630  BP: (!) 125/96 138/81  Pulse: 77 81  Resp: 20 15  Temp: 36.9 C 36.7 C    Last Pain:  Vitals:   05/03/17 1630  TempSrc:   PainSc: 0-No pain                 Jerra Huckeby COKER

## 2017-05-03 NOTE — Progress Notes (Signed)
Orthopedic Tech Progress Note Patient Details:  Nicole Stephenson 12/25/1945 [id number]  Ortho Devices Type of Ortho Device: Arm sling Ortho Device/Splint Interventions: Ordered, Application   Jennye MoccasinHughes, [name] Craig 05/03/2017, 4:38 PM

## 2017-05-03 NOTE — Anesthesia Procedure Notes (Signed)
Procedure Name: MAC Date/Time: 05/03/2017 3:54 PM Performed by: Everlean Cherry A Pre-anesthesia Checklist: Emergency Drugs available, Suction available, Patient being monitored and Patient identified Patient Re-evaluated:Patient Re-evaluated prior to induction Oxygen Delivery Method: Nasal cannula

## 2017-05-03 NOTE — H&P (Signed)
Nigel SloopBrenda Kees    Chief Complaint: Dislocated left reverse shoulder arthroplasty  HPI: The patient is a 71 y.o. female s/p L RSA approximately 3 weeks ago, fell 2 nights ago with c/o severe left shoulder pain, presented to office yesterday with anterior dislocation of left shoulder prosthesis.  Past Medical History:  Diagnosis Date  . Allergic rhinitis, seasonal   . Anemia   . Anxiety   . Arthritis   . Asthma    exercise induced  . Chronic kidney disease    Stage III  . Constipation   . Depression   . DJD (degenerative joint disease)   . DVT (deep venous thrombosis) (HCC) 1997  . Dyspnea    with exertion  . GERD (gastroesophageal reflux disease)   . 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 History of kidney stones    passed x 2  . Hypertension   . Pneumonia     Past Surgical History:  Procedure Laterality Date  . bottom of left foot for keloids - 1993    . COLONOSCOPY    . REVERSE SHOULDER ARTHROPLASTY Left 04/12/2017   Procedure: REVERSE SHOULDER ARTHROPLASTY;  Surgeon: Francena HanlySupple, Quinlyn Tep, MD;  Location: [facility] OR;  Service: Orthopedics;  Laterality: Left;  . right foot surgery ,rod inserted in lateral side of foot    . TOTAL KNEE ARTHROPLASTY Left 02/08/2016   Procedure: LEFT TOTAL KNEE ARTHROPLASTY;  Surgeon: Durene RomansMatthew Olin, MD;  Location: [facility] ORS;  Service: Orthopedics;  Laterality: Left;    History reviewed. No pertinent family history.  Social History:  reports that she has never smoked. She has never used smokeless tobacco. She reports that she does not drink alcohol or use drugs.   Medications Prior to Admission  Medication Sig Dispense Refill  . acetaminophen (TYLENOL) 500 MG tablet Take 1,000 mg by mouth every 8 (eight) hours as needed for moderate pain.    Marland Kitchen. amLODipine (NORVASC) 10 MG tablet Take 10 mg by mouth daily.  3  . buPROPion (WELLBUTRIN SR) 150 MG 12 hr tablet Take 150 mg by mouth every morning.  2  . cetirizine (ZYRTEC) 10 MG tablet Take 10 mg by mouth every morning.    .  cholecalciferol (VITAMIN D) 1000 units tablet Take 1,000 Units by mouth daily.    . diazepam (VALIUM) 5 MG tablet Take 0.5-1 tablets (2.5-5 mg total) by mouth every 6 (six) hours as needed for muscle spasms or sedation. 40 tablet 1  . DULoxetine (CYMBALTA) 30 MG capsule Take 30 mg by mouth daily.  11  . hydrochlorothiazide (HYDRODIURIL) 25 MG tablet Take 25 mg by mouth every morning.    Marland Kitchen. lisinopril (PRINIVIL,ZESTRIL) 20 MG tablet Take 20 mg by mouth 2 (two) times daily.    Marland Kitchen. omeprazole (PRILOSEC OTC) 20 MG tablet Take 20 mg by mouth 2 (two) times daily.     Marland Kitchen. oxyCODONE-acetaminophen (PERCOCET) 5-325 MG tablet Take 1-2 tablets by mouth every 4 (four) hours as needed. 40 tablet 0  . pantoprazole (PROTONIX) 40 MG tablet Take 40 mg by mouth 2 (two) times daily.    . polyethylene glycol (MIRALAX / GLYCOLAX) packet Take 17 g by mouth 2 (two) times daily. (Patient taking differently: Take 17 g by mouth daily. ) 14 each 0  . pravastatin (PRAVACHOL) 40 MG tablet Take 40 mg by mouth every morning.    . traZODone (DESYREL) 50 MG tablet Take 50 mg by mouth at bedtime. Sleep.   3  . anti-nausea (EMETROL) solution Take  30 mLs by mouth daily as needed for nausea or vomiting (acid reflux).    . fluticasone (FLONASE) 50 MCG/ACT nasal spray Place 2 sprays into both nostrils 2 (two) times daily as needed for allergies or rhinitis.    . Lactobacillus Acid-Pectin (ACIDOPHILUS/CITRUS PECTIN) TABS Take 2 tablets by mouth daily.    . meclizine (ANTIVERT) 25 MG tablet Take 25 mg by mouth every 6 (six) hours as needed for dizziness.    . ondansetron (ZOFRAN) 4 MG tablet Take 1 tablet (4 mg total) by mouth every 8 (eight) hours as needed for nausea or vomiting. 20 tablet 0  . valACYclovir (VALTREX) 500 MG tablet TAKE 1 TABLET BY MOUTH TWICE DAILY  FOR 5 DAYS FOR OUTBREAKS  2     Physical Exam: left shoulder incision healing well, marked prominence of anterior deltoid due to underlying prosthesis, grossly n/v  intact  Vitals  Temp:  [98.8 F (37.1 C)] 98.8 F (37.1 C) (07/19 1159) Pulse Rate:  [90] 90 (07/19 1159) Resp:  [18] 18 (07/19 1159) BP: (153)/(88) 153/88 (07/19 1159) SpO2:  [100 %] 100 % (07/19 1159) Weight:  [86.6 kg (191 lb)] 86.6 kg (191 lb) (07/19 1159)  Assessment/Plan  Impression: Dislocated left reverse shoulder arthroplasty   Plan of Action: Procedure(s): Closed reduction left reverse shoulder arthroplasty versus Open reduction and revision  Kharma Sampsel M Lynnlee Revels 05/03/2017, 3:05 PM Contact # [phone]

## 2017-05-03 NOTE — Transfer of Care (Signed)
Immediate Anesthesia Transfer of Care Note  Patient: Nicole Stephenson  Procedure(s) Performed: Procedure(s): Closed reduction left shoulder  (Left)  Patient Location: PACU  Anesthesia Type:MAC combined with regional for post-op pain  Level of Consciousness: awake, alert , oriented and patient cooperative  Airway & Oxygen Therapy: Patient Spontanous Breathing and Patient connected to nasal cannula oxygen  Post-op Assessment: Report given to RN and Post -op Vital signs reviewed and stable  Post vital signs: Reviewed and stable  Last Vitals:  Vitals:   05/03/17 1159 05/03/17 1608  BP: (!) 153/88 (!) 125/96  Pulse: 90 77  Resp: 18 20  Temp: 37.1 C 36.9 C    Last Pain:  Vitals:   05/03/17 1608  TempSrc:   PainSc: 0-No pain         Complications: No apparent anesthesia complications

## 2017-05-03 NOTE — Discharge Instructions (Signed)
Sling on but may come out to allow arm to dangle and move elbow wrist and hand and for hygiene Keep your hand in your field of vision and try to avoid weight bearing through your arm. You may shower

## 2017-05-03 NOTE — Op Note (Signed)
05/03/2017  4:05 PM  PATIENT:   Nicole Stephenson  71 y.o. female  PRE-OPERATIVE DIAGNOSIS:  Dislocated left reverse shoulder arthroplasty   POST-OPERATIVE DIAGNOSIS:  same  PROCEDURE:  Closed reduction of dislocated left reverse shoulder arthroplasty  SURGEON:  Azyriah Nevins, Metta Clines M.D.  ASSISTANTS: Shuford pac   ANESTHESIA:   MAC + ISB  EBL: none  SPECIMEN:  none  Drains: none   PATIENT DISPOSITION:  PACU - hemodynamically stable.    PLAN OF CARE: Discharge to home after PACU  Dictation# [id number]   Contact # [phone]

## 2017-05-03 NOTE — Anesthesia Preprocedure Evaluation (Addendum)
Anesthesia Evaluation  Patient identified by MRN, date of birth, ID band Patient awake    Reviewed: Allergy & Precautions, NPO status , Patient's Chart, lab work & pertinent test results  History of Anesthesia Complications Negative for: history of anesthetic complications  Airway Mallampati: II  TM Distance: >3 FB Neck ROM: Full    Dental  (+) Teeth Intact, Dental Advisory Given,    Pulmonary asthma ,    Pulmonary exam normal        Cardiovascular hypertension, Pt. on medications  Rhythm:Regular Rate:Normal     Neuro/Psych Anxiety Depression negative neurological ROS     GI/Hepatic Neg liver ROS, GERD  ,  Endo/Other  negative endocrine ROS  Renal/GU Renal InsufficiencyRenal disease     Musculoskeletal  (+) Arthritis ,   Abdominal   Peds  Hematology  (+) anemia ,   Anesthesia Other Findings   Reproductive/Obstetrics                            Lab Results  Component Value Date   WBC 5.7 04/05/2017   HGB 11.4 (L) 04/05/2017   HCT 34.9 (L) 04/05/2017   MCV 88.8 04/05/2017   PLT 285 04/05/2017   Lab Results  Component Value Date   CREATININE 1.18 (H) 04/05/2017   BUN 8 04/05/2017   NA 130 (L) 04/05/2017   K 3.4 (L) 04/05/2017   CL 95 (L) 04/05/2017   CO2 25 04/05/2017    Anesthesia Physical  Anesthesia Plan  ASA: II  Anesthesia Plan: General and MAC   Post-op Pain Management:  Regional for Post-op pain   Induction: Intravenous  PONV Risk Score and Plan: 3 and Ondansetron, Dexamethasone, Treatment may vary due to age or medical condition and Diphenhydramine  Airway Management Planned: Oral ETT  Additional Equipment:   Intra-op Plan:   Post-operative Plan: Extubation in OR  Informed Consent: I have reviewed the patients History and Physical, chart, labs and discussed the procedure including the risks, benefits and alternatives for the proposed anesthesia with the  patient or authorized representative who has indicated his/her understanding and acceptance.   Dental advisory given  Plan Discussed with: CRNA, Surgeon and Anesthesiologist  Anesthesia Plan Comments:         Anesthesia Quick Evaluation

## 2017-05-04 ENCOUNTER — Encounter (HOSPITAL_COMMUNITY): Payer: Self-pay | Admitting: Orthopedic Surgery
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
## 2017-05-21 DIAGNOSIS — Z471 Aftercare following joint replacement surgery: Secondary | ICD-10-CM | POA: Diagnosis not present

## 2017-05-21 DIAGNOSIS — Z96612 Presence of left artificial shoulder joint: Secondary | ICD-10-CM | POA: Diagnosis not present

## 2017-05-23 ENCOUNTER — Encounter (HOSPITAL_COMMUNITY): Payer: Self-pay | Admitting: *Deleted

## 2017-05-23 MED ORDER — CEFAZOLIN SODIUM-DEXTROSE 2-4 GM/100ML-% IV SOLN
2.0000 g | INTRAVENOUS | Status: AC
  Administered 2017-05-24: 2 g via INTRAVENOUS
  Filled 2017-05-23: qty 100

## 2017-05-23 NOTE — Progress Notes (Signed)
Pt verified that medical and surgical history has not changed since she was here in July. Some medication changes were made and documented. She denies any chest pain or sob.

## 2017-05-24 ENCOUNTER — Inpatient Hospital Stay (HOSPITAL_COMMUNITY): Payer: Medicare Other | Admitting: Anesthesiology

## 2017-05-24 ENCOUNTER — Encounter (HOSPITAL_COMMUNITY)
Admission: RE | Disposition: A | Discharge status: Home Health | Payer: Self-pay | Source: Ambulatory Visit | Attending: Orthopedic Surgery

## 2017-05-24 ENCOUNTER — Inpatient Hospital Stay (HOSPITAL_COMMUNITY)
Admission: RE | Admit: 2017-05-24 | Discharge: 2017-05-25 | DRG: 483 | Disposition: A | Discharge status: Home Health | Payer: Medicare Other | Source: Ambulatory Visit | Attending: Orthopedic Surgery | Admitting: Orthopedic Surgery

## 2017-05-24 DIAGNOSIS — I129 Hypertensive chronic kidney disease with stage 1 through stage 4 chronic kidney disease, or unspecified chronic kidney disease: Secondary | ICD-10-CM | POA: Diagnosis not present

## 2017-05-24 DIAGNOSIS — F329 Major depressive disorder, single episode, unspecified: Secondary | ICD-10-CM | POA: Diagnosis present

## 2017-05-24 DIAGNOSIS — Z96652 Presence of left artificial knee joint: Secondary | ICD-10-CM | POA: Diagnosis present

## 2017-05-24 DIAGNOSIS — J45990 Exercise induced bronchospasm: Secondary | ICD-10-CM | POA: Diagnosis present

## 2017-05-24 DIAGNOSIS — Z79899 Other long term (current) drug therapy: Secondary | ICD-10-CM

## 2017-05-24 DIAGNOSIS — T84028A Dislocation of other internal joint prosthesis, initial encounter: Secondary | ICD-10-CM | POA: Diagnosis not present

## 2017-05-24 DIAGNOSIS — T84038A Mechanical loosening of other internal prosthetic joint, initial encounter: Secondary | ICD-10-CM | POA: Diagnosis not present

## 2017-05-24 DIAGNOSIS — Z86718 Personal history of other venous thrombosis and embolism: Secondary | ICD-10-CM | POA: Diagnosis not present

## 2017-05-24 DIAGNOSIS — K219 Gastro-esophageal reflux disease without esophagitis: Secondary | ICD-10-CM | POA: Diagnosis present

## 2017-05-24 DIAGNOSIS — D649 Anemia, unspecified: Secondary | ICD-10-CM | POA: Diagnosis present

## 2017-05-24 DIAGNOSIS — G8918 Other acute postprocedural pain: Secondary | ICD-10-CM | POA: Diagnosis not present

## 2017-05-24 DIAGNOSIS — N183 Chronic kidney disease, stage 3 (moderate): Secondary | ICD-10-CM | POA: Diagnosis not present

## 2017-05-24 DIAGNOSIS — Z96619 Presence of unspecified artificial shoulder joint: Secondary | ICD-10-CM

## 2017-05-24 DIAGNOSIS — Z9181 History of falling: Secondary | ICD-10-CM | POA: Diagnosis not present

## 2017-05-24 DIAGNOSIS — Z96612 Presence of left artificial shoulder joint: Secondary | ICD-10-CM | POA: Diagnosis not present

## 2017-05-24 HISTORY — PX: REVISION TOTAL SHOULDER TO REVERSE TOTAL SHOULDER: SHX6313

## 2017-05-24 LAB — BASIC METABOLIC PANEL
Anion gap: 9 (ref 5–15)
BUN: 15 mg/dL (ref 6–20)
CALCIUM: 9.5 mg/dL (ref 8.9–10.3)
CO2: 23 mmol/L (ref 22–32)
CREATININE: 1.3 mg/dL — AB (ref 0.44–1.00)
Chloride: 100 mmol/L — ABNORMAL LOW (ref 101–111)
GFR calc non Af Amer: 40 mL/min — ABNORMAL LOW (ref >60)
GFR, EST AFRICAN AMERICAN: 47 mL/min — AB (ref >60)
Glucose, Bld: 105 mg/dL — ABNORMAL HIGH (ref 65–99)
Potassium: 4 mmol/L (ref 3.5–5.1)
SODIUM: 132 mmol/L — AB (ref 135–145)

## 2017-05-24 LAB — CBC
HEMATOCRIT: 31.1 % — AB (ref 36.0–46.0)
HEMOGLOBIN: 10.3 g/dL — AB (ref 12.0–15.0)
MCH: 29.3 pg (ref 26.0–34.0)
MCHC: 33.1 g/dL (ref 30.0–36.0)
MCV: 88.4 fL (ref 78.0–100.0)
PLATELETS: 271 10*3/uL (ref 150–400)
RBC: 3.52 MIL/uL — AB (ref 3.87–5.11)
RDW: 14.6 % (ref 11.5–15.5)
WBC: 5.4 10*3/uL (ref 4.0–10.5)

## 2017-05-24 LAB — TYPE AND SCREEN
ABO/RH(D): A POS
Antibody Screen: NEGATIVE

## 2017-05-24 LAB — GLUCOSE, CAPILLARY: GLUCOSE-CAPILLARY: 98 mg/dL (ref 65–99)

## 2017-05-24 LAB — ABO/RH: ABO/RH(D): A POS

## 2017-05-24 SURGERY — REVISION, REVERSE TOTAL ARTHROPLASTY, SHOULDER
Anesthesia: General | Site: Shoulder | Laterality: Left

## 2017-05-24 MED ORDER — PHENYLEPHRINE HCL 10 MG/ML IJ SOLN
INTRAVENOUS | Status: DC | PRN
  Administered 2017-05-24: 40 ug/min via INTRAVENOUS

## 2017-05-24 MED ORDER — PROPOFOL 10 MG/ML IV BOLUS
INTRAVENOUS | Status: AC
  Filled 2017-05-24: qty 20

## 2017-05-24 MED ORDER — LIDOCAINE 2% (20 MG/ML) 5 ML SYRINGE
INTRAMUSCULAR | Status: AC
  Filled 2017-05-24: qty 20

## 2017-05-24 MED ORDER — 0.9 % SODIUM CHLORIDE (POUR BTL) OPTIME
TOPICAL | Status: DC | PRN
  Administered 2017-05-24: 1000 mL

## 2017-05-24 MED ORDER — LIDOCAINE 2% (20 MG/ML) 5 ML SYRINGE
INTRAMUSCULAR | Status: DC | PRN
  Administered 2017-05-24: 60 mg via INTRAVENOUS

## 2017-05-24 MED ORDER — CEFAZOLIN SODIUM-DEXTROSE 2-4 GM/100ML-% IV SOLN
2.0000 g | Freq: Four times a day (QID) | INTRAVENOUS | Status: AC
  Administered 2017-05-25 (×3): 2 g via INTRAVENOUS
  Filled 2017-05-24 (×3): qty 100

## 2017-05-24 MED ORDER — FENTANYL CITRATE (PF) 100 MCG/2ML IJ SOLN
INTRAMUSCULAR | Status: DC | PRN
  Administered 2017-05-24 (×3): 50 ug via INTRAVENOUS

## 2017-05-24 MED ORDER — ALUM & MAG HYDROXIDE-SIMETH 200-200-20 MG/5ML PO SUSP: 30.0000 mL | ORAL | Status: DC | PRN

## 2017-05-24 MED ORDER — SODIUM CHLORIDE 0.9 % IV SOLN
1000.0000 mg | INTRAVENOUS | Status: AC
  Administered 2017-05-24: 1000 mg via INTRAVENOUS
  Filled 2017-05-24: qty 10

## 2017-05-24 MED ORDER — ONDANSETRON HCL 4 MG PO TABS: 4.0000 mg | ORAL_TABLET | Freq: Four times a day (QID) | ORAL | Status: DC | PRN

## 2017-05-24 MED ORDER — ONDANSETRON HCL 4 MG/2ML IJ SOLN
INTRAMUSCULAR | Status: DC | PRN
  Administered 2017-05-24: 4 mg via INTRAVENOUS

## 2017-05-24 MED ORDER — METHOCARBAMOL 1000 MG/10ML IJ SOLN: 500.0000 mg | Freq: Four times a day (QID) | INTRAVENOUS | Status: DC | PRN

## 2017-05-24 MED ORDER — LACTATED RINGERS IV SOLN
INTRAVENOUS | Status: DC
  Administered 2017-05-24: via INTRAVENOUS

## 2017-05-24 MED ORDER — HYDROCHLOROTHIAZIDE 25 MG PO TABS: 25.0000 mg | ORAL_TABLET | Freq: Every morning | ORAL | Status: DC

## 2017-05-24 MED ORDER — HYDROMORPHONE HCL 1 MG/ML IJ SOLN: 1.0000 mg | INTRAMUSCULAR | Status: DC | PRN

## 2017-05-24 MED ORDER — DEXAMETHASONE SODIUM PHOSPHATE 10 MG/ML IJ SOLN
INTRAMUSCULAR | Status: AC
  Filled 2017-05-24: qty 1

## 2017-05-24 MED ORDER — PHENOL 1.4 % MT LIQD: 1.0000 | OROMUCOSAL | Status: DC | PRN

## 2017-05-24 MED ORDER — FENTANYL CITRATE (PF) 100 MCG/2ML IJ SOLN
25.0000 ug | INTRAMUSCULAR | Status: DC | PRN
  Administered 2017-05-24 (×2): 50 ug via INTRAVENOUS

## 2017-05-24 MED ORDER — METOCLOPRAMIDE HCL 5 MG/ML IJ SOLN: 5.0000 mg | Freq: Three times a day (TID) | INTRAMUSCULAR | Status: DC | PRN

## 2017-05-24 MED ORDER — PHENYLEPHRINE 40 MCG/ML (10ML) SYRINGE FOR IV PUSH (FOR BLOOD PRESSURE SUPPORT)
PREFILLED_SYRINGE | INTRAVENOUS | Status: AC
  Filled 2017-05-24: qty 50

## 2017-05-24 MED ORDER — DOCUSATE SODIUM 100 MG PO CAPS
100.0000 mg | ORAL_CAPSULE | Freq: Two times a day (BID) | ORAL | Status: DC
  Administered 2017-05-24 – 2017-05-25 (×2): 100 mg via ORAL
  Filled 2017-05-24 (×2): qty 1

## 2017-05-24 MED ORDER — LIDOCAINE HCL (CARDIAC) 20 MG/ML IV SOLN
INTRAVENOUS | Status: DC | PRN
  Administered 2017-05-24: 40 mg via INTRATRACHEAL

## 2017-05-24 MED ORDER — ACETAMINOPHEN 650 MG RE SUPP: 650.0000 mg | Freq: Four times a day (QID) | RECTAL | Status: DC | PRN

## 2017-05-24 MED ORDER — SUGAMMADEX SODIUM 200 MG/2ML IV SOLN
INTRAVENOUS | Status: AC
  Filled 2017-05-24: qty 2

## 2017-05-24 MED ORDER — FLUTICASONE PROPIONATE 50 MCG/ACT NA SUSP: 2.0000 | Freq: Two times a day (BID) | NASAL | Status: DC | PRN

## 2017-05-24 MED ORDER — MENTHOL 3 MG MT LOZG: 1.0000 | LOZENGE | OROMUCOSAL | Status: DC | PRN

## 2017-05-24 MED ORDER — METOCLOPRAMIDE HCL 5 MG PO TABS: 5.0000 mg | ORAL_TABLET | Freq: Three times a day (TID) | ORAL | Status: DC | PRN

## 2017-05-24 MED ORDER — SUGAMMADEX SODIUM 200 MG/2ML IV SOLN
INTRAVENOUS | Status: DC | PRN
  Administered 2017-05-24: 167.8 mg via INTRAVENOUS

## 2017-05-24 MED ORDER — LACTATED RINGERS IV SOLN
INTRAVENOUS | Status: DC
  Administered 2017-05-24: 13:00:00 via INTRAVENOUS

## 2017-05-24 MED ORDER — AMLODIPINE BESYLATE 10 MG PO TABS: 10.0000 mg | ORAL_TABLET | Freq: Every day | ORAL | Status: DC

## 2017-05-24 MED ORDER — FENTANYL CITRATE (PF) 100 MCG/2ML IJ SOLN
INTRAMUSCULAR | Status: AC
  Administered 2017-05-24: 50 ug
  Filled 2017-05-24: qty 2

## 2017-05-24 MED ORDER — PROPOFOL 10 MG/ML IV BOLUS
INTRAVENOUS | Status: DC | PRN
  Administered 2017-05-24: 150 mg via INTRAVENOUS

## 2017-05-24 MED ORDER — DEXAMETHASONE SODIUM PHOSPHATE 10 MG/ML IJ SOLN
INTRAMUSCULAR | Status: DC | PRN
  Administered 2017-05-24: 10 mg via INTRAVENOUS

## 2017-05-24 MED ORDER — TRAMADOL HCL 50 MG PO TABS
50.0000 mg | ORAL_TABLET | Freq: Four times a day (QID) | ORAL | Status: DC | PRN
  Administered 2017-05-25: 50 mg via ORAL
  Filled 2017-05-24: qty 1

## 2017-05-24 MED ORDER — HYDROMORPHONE HCL 2 MG PO TABS
2.0000 mg | ORAL_TABLET | ORAL | Status: DC | PRN
  Administered 2017-05-24: 2 mg via ORAL
  Filled 2017-05-24: qty 1

## 2017-05-24 MED ORDER — CHLORHEXIDINE GLUCONATE 4 % EX LIQD
60.0000 mL | Freq: Once | CUTANEOUS | Status: DC
  Filled 2017-05-24: qty 60

## 2017-05-24 MED ORDER — ONDANSETRON HCL 4 MG/2ML IJ SOLN: 4.0000 mg | Freq: Four times a day (QID) | INTRAMUSCULAR | Status: DC | PRN

## 2017-05-24 MED ORDER — SODIUM CHLORIDE 0.9 % IV SOLN: INTRAVENOUS | Status: DC | PRN

## 2017-05-24 MED ORDER — POLYETHYLENE GLYCOL 3350 17 G PO PACK
17.0000 g | PACK | Freq: Every day | ORAL | Status: DC | PRN
  Administered 2017-05-24: 17 g via ORAL
  Filled 2017-05-24: qty 1

## 2017-05-24 MED ORDER — LISINOPRIL 20 MG PO TABS: 20.0000 mg | ORAL_TABLET | Freq: Two times a day (BID) | ORAL | Status: DC

## 2017-05-24 MED ORDER — MIDAZOLAM HCL 2 MG/2ML IJ SOLN
INTRAMUSCULAR | Status: DC | PRN
  Administered 2017-05-24: 1 mg via INTRAVENOUS

## 2017-05-24 MED ORDER — DULOXETINE HCL 30 MG PO CPEP
30.0000 mg | ORAL_CAPSULE | Freq: Every day | ORAL | Status: DC
  Administered 2017-05-25: 30 mg via ORAL
  Filled 2017-05-24: qty 1

## 2017-05-24 MED ORDER — FENTANYL CITRATE (PF) 100 MCG/2ML IJ SOLN
INTRAMUSCULAR | Status: AC
  Administered 2017-05-24: 50 ug via INTRAVENOUS
  Filled 2017-05-24: qty 2

## 2017-05-24 MED ORDER — ROCURONIUM BROMIDE 100 MG/10ML IV SOLN
INTRAVENOUS | Status: DC | PRN
  Administered 2017-05-24: 40 mg via INTRAVENOUS

## 2017-05-24 MED ORDER — PHENYLEPHRINE HCL 10 MG/ML IJ SOLN
INTRAMUSCULAR | Status: AC
  Filled 2017-05-24: qty 1

## 2017-05-24 MED ORDER — METHOCARBAMOL 500 MG PO TABS
ORAL_TABLET | ORAL | Status: AC
  Administered 2017-05-24: 500 mg via ORAL
  Filled 2017-05-24: qty 1

## 2017-05-24 MED ORDER — TRAZODONE HCL 50 MG PO TABS
50.0000 mg | ORAL_TABLET | Freq: Every day | ORAL | Status: DC
  Administered 2017-05-24: 50 mg via ORAL
  Filled 2017-05-24: qty 1

## 2017-05-24 MED ORDER — ROCURONIUM BROMIDE 10 MG/ML (PF) SYRINGE
PREFILLED_SYRINGE | INTRAVENOUS | Status: AC
  Filled 2017-05-24: qty 10

## 2017-05-24 MED ORDER — ONDANSETRON HCL 4 MG/2ML IJ SOLN
INTRAMUSCULAR | Status: AC
  Filled 2017-05-24: qty 2

## 2017-05-24 MED ORDER — PANTOPRAZOLE SODIUM 40 MG PO TBEC
40.0000 mg | DELAYED_RELEASE_TABLET | Freq: Two times a day (BID) | ORAL | Status: DC
  Administered 2017-05-24 – 2017-05-25 (×2): 40 mg via ORAL
  Filled 2017-05-24 (×2): qty 1

## 2017-05-24 MED ORDER — KETOROLAC TROMETHAMINE 15 MG/ML IJ SOLN
7.5000 mg | Freq: Four times a day (QID) | INTRAMUSCULAR | Status: DC
  Administered 2017-05-24 – 2017-05-25 (×3): 7.5 mg via INTRAVENOUS
  Filled 2017-05-24 (×3): qty 1

## 2017-05-24 MED ORDER — ACETAMINOPHEN 325 MG PO TABS: 650.0000 mg | ORAL_TABLET | Freq: Four times a day (QID) | ORAL | Status: DC | PRN

## 2017-05-24 MED ORDER — BUPIVACAINE-EPINEPHRINE (PF) 0.5% -1:200000 IJ SOLN
INTRAMUSCULAR | Status: DC | PRN
  Administered 2017-05-24: 15 mL via PERINEURAL

## 2017-05-24 MED ORDER — MIDAZOLAM HCL 2 MG/2ML IJ SOLN
INTRAMUSCULAR | Status: AC
  Administered 2017-05-24: 1 mg
  Filled 2017-05-24: qty 2

## 2017-05-24 MED ORDER — DEXAMETHASONE SODIUM PHOSPHATE 10 MG/ML IJ SOLN
INTRAMUSCULAR | Status: AC
  Filled 2017-05-24: qty 4

## 2017-05-24 MED ORDER — DIPHENHYDRAMINE HCL 12.5 MG/5ML PO ELIX: 12.5000 mg | ORAL_SOLUTION | ORAL | Status: DC | PRN

## 2017-05-24 MED ORDER — ONDANSETRON HCL 4 MG/2ML IJ SOLN: 4.0000 mg | Freq: Once | INTRAMUSCULAR | Status: DC | PRN

## 2017-05-24 MED ORDER — PHENYLEPHRINE HCL 10 MG/ML IJ SOLN
INTRAMUSCULAR | Status: DC | PRN
  Administered 2017-05-24: 80 ug via INTRAVENOUS
  Administered 2017-05-24: 120 ug via INTRAVENOUS

## 2017-05-24 MED ORDER — METHOCARBAMOL 500 MG PO TABS
500.0000 mg | ORAL_TABLET | Freq: Four times a day (QID) | ORAL | Status: DC | PRN
  Administered 2017-05-24: 500 mg via ORAL
  Filled 2017-05-24: qty 1

## 2017-05-24 MED ORDER — BISACODYL 5 MG PO TBEC: 5.0000 mg | DELAYED_RELEASE_TABLET | Freq: Every day | ORAL | Status: DC | PRN

## 2017-05-24 MED ORDER — ONDANSETRON HCL 4 MG/2ML IJ SOLN
INTRAMUSCULAR | Status: AC
  Filled 2017-05-24: qty 6

## 2017-05-24 MED ORDER — BUPROPION HCL ER (SR) 150 MG PO TB12
150.0000 mg | ORAL_TABLET | Freq: Every morning | ORAL | Status: DC
  Administered 2017-05-25: 150 mg via ORAL
  Filled 2017-05-24: qty 1

## 2017-05-24 MED ORDER — FENTANYL CITRATE (PF) 250 MCG/5ML IJ SOLN
INTRAMUSCULAR | Status: AC
  Filled 2017-05-24: qty 5

## 2017-05-24 MED ORDER — MAGNESIUM CITRATE PO SOLN: 1.0000 | Freq: Once | ORAL | Status: DC | PRN

## 2017-05-24 SURGICAL SUPPLY — 54 items
BLADE SAW SGTL 83.5X18.5 (BLADE) ×2 IMPLANT
CEMENT BONE DEPUY (Cement) ×4 IMPLANT
CEMENT RESTRICTOR DEPUY SZ 3 (Cement) ×2 IMPLANT
COVER SURGICAL LIGHT HANDLE (MISCELLANEOUS) ×2 IMPLANT
DERMABOND ADVANCED (GAUZE/BANDAGES/DRESSINGS) ×1
DERMABOND ADVANCED .7 DNX12 (GAUZE/BANDAGES/DRESSINGS) ×1 IMPLANT
DRAPE ORTHO SPLIT 77X108 STRL (DRAPES) ×2
DRAPE SURG 17X11 SM STRL (DRAPES) ×2 IMPLANT
DRAPE SURG ORHT 6 SPLT 77X108 (DRAPES) ×2 IMPLANT
DRAPE U-SHAPE 47X51 STRL (DRAPES) ×2 IMPLANT
DRSG AQUACEL AG ADV 3.5X10 (GAUZE/BANDAGES/DRESSINGS) ×2 IMPLANT
DURAPREP 26ML APPLICATOR (WOUND CARE) ×2 IMPLANT
ELECT BLADE 4.0 EZ CLEAN MEGAD (MISCELLANEOUS) ×2
ELECT CAUTERY BLADE 6.4 (BLADE) ×2 IMPLANT
ELECT REM PT RETURN 9FT ADLT (ELECTROSURGICAL) ×2
ELECTRODE BLDE 4.0 EZ CLN MEGD (MISCELLANEOUS) ×1 IMPLANT
ELECTRODE REM PT RTRN 9FT ADLT (ELECTROSURGICAL) ×1 IMPLANT
FACESHIELD WRAPAROUND (MASK) ×6 IMPLANT
GLOVE BIO SURGEON STRL SZ7.5 (GLOVE) ×2 IMPLANT
GLOVE BIO SURGEON STRL SZ8 (GLOVE) ×2 IMPLANT
GLOVE EUDERMIC 7 POWDERFREE (GLOVE) ×2 IMPLANT
GLOVE SS BIOGEL STRL SZ 7.5 (GLOVE) ×1 IMPLANT
GLOVE SUPERSENSE BIOGEL SZ 7.5 (GLOVE) ×1
GOWN STRL REUS W/ TWL LRG LVL3 (GOWN DISPOSABLE) ×1 IMPLANT
GOWN STRL REUS W/ TWL XL LVL3 (GOWN DISPOSABLE) ×2 IMPLANT
GOWN STRL REUS W/TWL LRG LVL3 (GOWN DISPOSABLE) ×1
GOWN STRL REUS W/TWL XL LVL3 (GOWN DISPOSABLE) ×2
INSERT HUMERAL UNI REVERS 36 6 (Insert) ×2 IMPLANT
KIT BASIN OR (CUSTOM PROCEDURE TRAY) ×2 IMPLANT
KIT ROOM TURNOVER OR (KITS) ×2 IMPLANT
MANIFOLD NEPTUNE II (INSTRUMENTS) ×2 IMPLANT
NDL SUT 6 .5 CRC .975X.05 MAYO (NEEDLE) IMPLANT
NEEDLE HYPO 25GX1X1/2 BEV (NEEDLE) IMPLANT
NEEDLE MAYO TAPER (NEEDLE)
NEEDLE TAPERED W/ NITINOL LOOP (MISCELLANEOUS) ×2 IMPLANT
NS IRRIG 1000ML POUR BTL (IV SOLUTION) ×2 IMPLANT
PACK SHOULDER (CUSTOM PROCEDURE TRAY) ×2 IMPLANT
PAD ARMBOARD 7.5X6 YLW CONV (MISCELLANEOUS) ×4 IMPLANT
RESTRAINT HEAD UNIVERSAL NS (MISCELLANEOUS) ×2 IMPLANT
SLING ARM FOAM STRAP LRG (SOFTGOODS) ×2 IMPLANT
SPONGE LAP 18X18 X RAY DECT (DISPOSABLE) ×2 IMPLANT
SPONGE LAP 4X18 X RAY DECT (DISPOSABLE) ×2 IMPLANT
SUCTION FRAZIER HANDLE 10FR (MISCELLANEOUS)
SUCTION TUBE FRAZIER 10FR DISP (MISCELLANEOUS) IMPLANT
SUT FIBERWIRE #2 38 T-5 BLUE (SUTURE) ×6
SUT MNCRL AB 3-0 PS2 18 (SUTURE) ×2 IMPLANT
SUT MON AB 2-0 CT1 27 (SUTURE) ×2 IMPLANT
SUT VIC AB 1 CT1 27 (SUTURE) ×1
SUT VIC AB 1 CT1 27XBRD ANBCTR (SUTURE) ×1 IMPLANT
SUTURE FIBERWR #2 38 T-5 BLUE (SUTURE) ×3 IMPLANT
SYR CONTROL 10ML LL (SYRINGE) IMPLANT
TOWEL OR 17X24 6PK STRL BLUE (TOWEL DISPOSABLE) ×2 IMPLANT
TOWEL OR 17X26 10 PK STRL BLUE (TOWEL DISPOSABLE) ×2 IMPLANT
WATER STERILE IRR 1000ML POUR (IV SOLUTION) ×2 IMPLANT

## 2017-05-24 NOTE — Anesthesia Procedure Notes (Signed)
Anesthesia Regional Block: Interscalene brachial plexus block   Pre-Anesthetic Checklist: ,, timeout performed, Correct Patient, Correct Site, Correct Laterality, Correct Procedure, Correct Position, site marked, Risks and benefits discussed,  Surgical consent,  Pre-op evaluation,  At surgeon's request and post-op pain management  Laterality: Left  Prep: chloraprep       Needles:  Injection technique: Single-shot  Needle Type: Echogenic Needle     Needle Length: 9cm  Needle Gauge: 21     Additional Needles:   Procedures: ultrasound guided,,,,,,,,  Narrative:  Start time: 05/24/2017 2:49 PM End time: 05/24/2017 2:55 PM Injection made incrementally with aspirations every 5 mL.  Performed by: Personally  Anesthesiologist: Leslye PeerBROCK, Draden Cottingham E  Additional Notes: No pain on injection. No increased resistance to injection. Injection made in 5cc increments. Good needle visualization. Patient tolerated the procedure well.

## 2017-05-24 NOTE — Transfer of Care (Signed)
Immediate Anesthesia Transfer of Care Note  Patient: Nicole [facility]  Procedure(s) Performed: Procedure(s): Revision left reverse shoulder arthroplasty (Left)  Patient Location: PACU  Anesthesia Type:General  Level of Consciousness: awake  Airway & Oxygen Therapy: Patient Spontanous Breathing  Post-op Assessment: Report given to RN and Post -op Vital signs reviewed and stable  Post vital signs: Reviewed and stable  Last Vitals:  Vitals:   05/24/17 1315 05/24/17 1455  BP: 137/72 (!) 135/56  Pulse: 88 81  Resp: 16 17  Temp: 36.9 C   SpO2: 100% 99%    Last Pain:  Vitals:   05/24/17 1315  TempSrc: Oral         Complications: No apparent anesthesia complications

## 2017-05-24 NOTE — Discharge Instructions (Signed)

## 2017-05-24 NOTE — H&P (Signed)
[facility]    Chief Complaint: Dislocated left reverse shoulder arthroplasty  HPI: The patient is a 71 y.o. female s/p L reverse shoulder arthroplasty with subsequent falls and now with second dislocation of shoulder prosthesis. To OR today for revsion  Past Medical History:  Diagnosis Date  . Allergic rhinitis, seasonal   . Anemia   . Anxiety   . Arthritis   . Asthma    exercise induced  . Chronic kidney disease    Stage III  . Constipation   . Depression   . DJD (degenerative joint disease)   . DVT (deep venous thrombosis) (HCC) 1997  . Dyspnea    with exertion  . GERD (gastroesophageal reflux disease)   . 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 History of kidney stones    passed x 2  . Hypertension   . Pneumonia     Past Surgical History:  Procedure Laterality Date  . bottom of left foot for keloids - 1993    . COLONOSCOPY    . REVERSE SHOULDER ARTHROPLASTY Left 04/12/2017   Procedure: REVERSE SHOULDER ARTHROPLASTY;  Surgeon: Francena Hanly, MD;  Location: [facility];  Service: Orthopedics;  Laterality: Left;  . right foot surgery ,rod inserted in lateral side of foot    . SHOULDER CLOSED REDUCTION Left 05/03/2017   Procedure: Closed reduction left shoulder ;  Surgeon: Francena Hanly, MD;  Location: [facility];  Service: Orthopedics;  Laterality: Left;  . TOTAL KNEE ARTHROPLASTY Left 02/08/2016   Procedure: LEFT TOTAL KNEE ARTHROPLASTY;  Surgeon: Durene Romans, MD;  Location: [facility];  Service: Orthopedics;  Laterality: Left;    Family History  Problem Relation Age of Onset  . Hypertension Mother   . Heart disease Mother   . Stroke Mother   . Hypertension Father     Social History:  reports that she has never smoked. She has never used smokeless tobacco. She reports that she does not drink alcohol or use drugs.   Medications Prior to Admission  Medication Sig Dispense Refill  . acetaminophen (TYLENOL) 500 MG tablet Take 1,000 mg by mouth every 8 (eight) hours as needed for moderate  pain.    Marland Kitchen amLODipine (NORVASC) 10 MG tablet Take 10 mg by mouth daily.  3  . buPROPion (WELLBUTRIN SR) 150 MG 12 hr tablet Take 150 mg by mouth every morning.  2  . cetirizine (ZYRTEC) 10 MG tablet Take 10 mg by mouth every morning.    . cholecalciferol (VITAMIN D) 1000 units tablet Take 1,000 Units by mouth daily.    . DULoxetine (CYMBALTA) 30 MG capsule Take 30 mg by mouth daily.  11  . hydrochlorothiazide (HYDRODIURIL) 25 MG tablet Take 25 mg by mouth every morning.    . Lactobacillus Acid-Pectin (ACIDOPHILUS/CITRUS PECTIN) TABS Take 2 tablets by mouth daily.    Marland Kitchen lisinopril (PRINIVIL,ZESTRIL) 20 MG tablet Take 20 mg by mouth 2 (two) times daily.    Marland Kitchen omeprazole (PRILOSEC OTC) 20 MG tablet Take 20 mg by mouth 2 (two) times daily.     . pantoprazole (PROTONIX) 40 MG tablet Take 40 mg by mouth 2 (two) times daily.    . pravastatin (PRAVACHOL) 40 MG tablet Take 40 mg by mouth every morning.    . traZODone (DESYREL) 50 MG tablet Take 50 mg by mouth at bedtime. Sleep.   3  . anti-nausea (EMETROL) solution Take 30 mLs by mouth daily as needed for nausea or vomiting (acid reflux).    Marland Kitchen  diazepam (VALIUM) 5 MG tablet Take 0.5-1 tablets (2.5-5 mg total) by mouth every 6 (six) hours as needed for muscle spasms or sedation. 40 tablet 1  . fluticasone (FLONASE) 50 MCG/ACT nasal spray Place 2 sprays into both nostrils 2 (two) times daily as needed for allergies or rhinitis.    Marland Kitchen. HYDROcodone-acetaminophen (NORCO) 5-325 MG tablet Take 1 tablet by mouth every 6 (six) hours as needed. 40 tablet 0  . meclizine (ANTIVERT) 25 MG tablet Take 25 mg by mouth every 6 (six) hours as needed for dizziness.    . ondansetron (ZOFRAN) 4 MG tablet Take 1 tablet (4 mg total) by mouth every 8 (eight) hours as needed for nausea or vomiting. 20 tablet 0  . polyethylene glycol (MIRALAX / GLYCOLAX) packet Take 17 g by mouth 2 (two) times daily. (Patient taking differently: Take 17 g by mouth daily. ) 14 each 0  . valACYclovir  (VALTREX) 500 MG tablet TAKE 1 TABLET BY MOUTH TWICE DAILY  FOR 5 DAYS FOR OUTBREAKS  2     Physical Exam: left shoulder with painful and restricted motion, incision intact, xray confirms anterior dislocation of humeral implant  Vitals  Temp:  [98.4 F (36.9 C)] 98.4 F (36.9 C) (08/09 1315) Pulse Rate:  [81-88] 81 (08/09 1455) Resp:  [16-17] 17 (08/09 1455) BP: (135-137)/(56-72) 135/56 (08/09 1455) SpO2:  [99 %-100 %] 99 % (08/09 1455) Weight:  [83.9 kg (185 lb)-86.6 kg (191 lb)] 83.9 kg (185 lb) (08/09 1325)  Assessment/Plan  Impression: Dislocated left reverse shoulder arthroplasty   Plan of Action: Procedure(s): Revision left reverse shoulder arthroplasty  Nicole [facility] 05/24/2017, 3:17 PM Contact # [phone]

## 2017-05-24 NOTE — Anesthesia Procedure Notes (Signed)
Procedure Name: Intubation Date/Time: 05/24/2017 3:59 PM Performed by: Valda Favia Pre-anesthesia Checklist: Patient identified, Emergency Drugs available, Suction available, Patient being monitored and Timeout performed Patient Re-evaluated:Patient Re-evaluated prior to induction Oxygen Delivery Method: Circle system utilized Preoxygenation: Pre-oxygenation with 100% oxygen Induction Type: IV induction Ventilation: Mask ventilation without difficulty and Oral airway inserted - appropriate to patient size Laryngoscope Size: Mac and 4 Grade View: Grade I Tube type: Oral Tube size: 7.0 mm Number of attempts: 1 Airway Equipment and Method: Stylet and LTA kit utilized Placement Confirmation: ETT inserted through vocal cords under direct vision,  positive ETCO2 and breath sounds checked- equal and bilateral Secured at: 21 cm Tube secured with: Tape Dental Injury: Teeth and Oropharynx as per pre-operative assessment

## 2017-05-24 NOTE — Anesthesia Preprocedure Evaluation (Addendum)
Anesthesia Evaluation  Patient identified by MRN, date of birth, ID band Patient awake    Reviewed: Allergy & Precautions, NPO status , Patient's Chart, lab work & pertinent test results  History of Anesthesia Complications (+) Emergence Delirium  Airway Mallampati: II  TM Distance: >3 FB Neck ROM: Full    Dental no notable dental hx. (+) Dental Advisory Given   Pulmonary asthma ,    Pulmonary exam normal breath sounds clear to auscultation       Cardiovascular hypertension, negative cardio ROS Normal cardiovascular exam Rhythm:Regular Rate:Normal     Neuro/Psych PSYCHIATRIC DISORDERS Anxiety Depression negative neurological ROS     GI/Hepatic negative GI ROS, Neg liver ROS, GERD  ,  Endo/Other  negative endocrine ROS  Renal/GU CRFRenal disease  negative genitourinary   Musculoskeletal  (+) Arthritis , Osteoarthritis,    Abdominal   Peds negative pediatric ROS (+)  Hematology  (+) anemia ,   Anesthesia Other Findings   Reproductive/Obstetrics negative OB ROS                            Anesthesia Physical Anesthesia Plan  ASA: III  Anesthesia Plan: General   Post-op Pain Management:  Regional for Post-op pain   Induction: Intravenous  PONV Risk Score and Plan: 3 and Ondansetron, Dexamethasone, Propofol infusion and Treatment may vary due to age or medical condition  Airway Management Planned: Oral ETT  Additional Equipment:   Intra-op Plan:   Post-operative Plan: Extubation in OR  Informed Consent: I have reviewed the patients History and Physical, chart, labs and discussed the procedure including the risks, benefits and alternatives for the proposed anesthesia with the patient or authorized representative who has indicated his/her understanding and acceptance.   Dental advisory given  Plan Discussed with: CRNA  Anesthesia Plan Comments:         Anesthesia Quick  Evaluation

## 2017-05-24 NOTE — Op Note (Signed)
05/24/2017  5:26 PM  PATIENT:   Nicole [facility]  71 y.o. female  PRE-OPERATIVE DIAGNOSIS:  Dislocated left reverse shoulder arthroplasty   POST-OPERATIVE DIAGNOSIS:  same  PROCEDURE:  revision L RSA, converting to cemented stem, +6 extension, +6 constrained poly  SURGEON:  Yaelis Scharfenberg, Vania ReaKevin M. M.D.  ASSISTANTS: Shuford pac   ANESTHESIA:   GET + ISB  EBL: 150  SPECIMEN:  none  Drains: none   PATIENT DISPOSITION:  PACU - hemodynamically stable.    PLAN OF CARE: Admit for overnight observation  Dictation# [id number]   Contact # [phone]

## 2017-05-25 ENCOUNTER — Encounter (HOSPITAL_COMMUNITY): Payer: Self-pay | Admitting: Orthopedic Surgery

## 2017-05-25 MED ORDER — HYDROMORPHONE HCL 1 MG/ML IJ SOLN: 1.0000 mg | INTRAMUSCULAR | Status: DC | PRN

## 2017-05-25 MED ORDER — TRAMADOL HCL 50 MG PO TABS: 50.0000 mg | ORAL_TABLET | Freq: Four times a day (QID) | ORAL | 0 refills | Status: DC | PRN

## 2017-05-25 NOTE — Care Management Note (Signed)
Case Management Note  Patient Details  Name: [facility] MRN: [id number] Date of Birth: 06/25/1946  Subjective/Objective:   71 yr old pleasant female s/p left total shoulder revision arthroplasty.               Action/Plan: Case manager spoke with patient concerning discharge plan. Choice was offered for Home Health agency, patient says she used [facility] with previous surgery and would like to do so now. CM called referral to Ayesha RumpfMary Yonjof, [facility] Liaison. Patient says her daughter and granddaughters will assist her at discharge.    Expected Discharge Date:  05/25/17               Expected Discharge Plan:  Home w Home Health Services  In-House Referral:  NA  Discharge planning Services     Post Acute Care Choice:  Home Health Choice offered to:  Patient  DME Arranged:  N/A DME Agency:  NA  HH Arranged:  OT, PT HH Agency:  [facility] (formerly [facility])  Status of Service:  Completed, signed off  If discussed at [employer] Meetings, dates discussed:    Additional Comments:  Durenda GuthrieBrady, Tailor Westfall Naomi, RN 05/25/2017, 2:01 PM

## 2017-05-25 NOTE — Progress Notes (Signed)
Pt discharge instructions reviewed with pt. Pt verbalizes understanding. Pt belongings with pt. Pt is waiting for her daughter to arrive to drive her home. Pt is not in distress.

## 2017-05-25 NOTE — Discharge Summary (Signed)
PATIENT ID:      Nicole [facility]  MRN:     [id number] DOB/AGE:    Apr 10, 1946 / 71 y.o.     DISCHARGE SUMMARY  ADMISSION DATE:    05/24/2017 DISCHARGE DATE:    ADMISSION DIAGNOSIS: Dislocated left reverse shoulder arthroplasty  Past Medical History:  Diagnosis Date  . Allergic rhinitis, seasonal   . Anemia   . Anxiety   . Arthritis   . Asthma    exercise induced  . Chronic kidney disease    Stage III  . Constipation   . Depression   . DJD (degenerative joint disease)   . DVT (deep venous thrombosis) (HCC) 1997  . Dyspnea    with exertion  . GERD (gastroesophageal reflux disease)   . 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 History of kidney stones    passed x 2  . Hypertension   . Pneumonia     DISCHARGE DIAGNOSIS:   Active Problems:   S/p reverse total shoulder arthroplasty   PROCEDURE: Procedure(s): Revision left reverse shoulder arthroplasty on 05/24/2017  CONSULTS:    HISTORY:  See H&P in chart.  HOSPITAL COURSE:  Nicole [facility] is a 85 y.o. admitted on 05/24/2017 with a diagnosis of Dislocated left reverse shoulder arthroplasty .  They were brought to the operating room on 05/24/2017 and underwent Procedure(s): Revision left reverse shoulder arthroplasty.    They were given perioperative antibiotics: Anti-infectives    Start     Dose/Rate Route Frequency Ordered Stop   05/24/17 2230  ceFAZolin (ANCEF) IVPB 2g/100 mL premix     2 g 200 mL/hr over 30 Minutes Intravenous Every 6 hours 05/24/17 2127 05/25/17 1629   05/24/17 1500  ceFAZolin (ANCEF) IVPB 2g/100 mL premix     2 g 200 mL/hr over 30 Minutes Intravenous To [facility] 05/23/17 1437 05/24/17 1603    .  Patient underwent the above named procedure and tolerated it well. The following day they were hemodynamically stable and pain was controlled on oral analgesics. They were neurovascularly intact to the operative extremity. OT was ordered and worked with patient per protocol. They were medically and orthopaedically  stable for discharge on day 1 .   home health services were arranged  DIAGNOSTIC STUDIES:  RECENT RADIOGRAPHIC STUDIES :  Dg Shoulder 1v Left  Result Date: 05/03/2017 CLINICAL DATA:  Close reduction left shoulder. EXAM: LEFT SHOULDER - 1 VIEW; DG C-ARM 61-120 MIN COMPARISON:  No prior . FINDINGS: Left shoulder appears to be reduced on one view. Patient has a total left shoulder replacement. Hardware appears to be intact on one view . IMPRESSION: Left shoulder appears to be reduced on one view. Electronically Signed   By: Maisie Fus  Register   On: 05/03/2017 16:39   Dg C-arm 1-60 Min  Result Date: 05/03/2017 CLINICAL DATA:  Close reduction left shoulder. EXAM: LEFT SHOULDER - 1 VIEW; DG C-ARM 61-120 MIN COMPARISON:  No prior . FINDINGS: Left shoulder appears to be reduced on one view. Patient has a total left shoulder replacement. Hardware appears to be intact on one view . IMPRESSION: Left shoulder appears to be reduced on one view. Electronically Signed   By: Maisie Fus  Register   On: 05/03/2017 16:39    RECENT VITAL SIGNS:  Patient Vitals for the past 24 hrs:  BP Temp Temp src Pulse Resp SpO2 Weight  05/25/17 0400 (!) 110/58 (!) 97.4 F (36.3 C) Oral 71 18 100 % -  05/25/17 0000 109/71 97.7  F (36.5 C) Oral 71 18 100 % -  05/24/17 2230 109/71 - - - - - -  05/24/17 2151 (!) 112/56 97.7 F (36.5 C) Oral 77 16 99 % -  05/24/17 2026 (!) 122/59 97.6 F (36.4 C) - 72 18 100 % -  05/24/17 2010 (!) 111/55 - - 72 12 100 % -  05/24/17 1955 (!) 109/54 - - 75 12 99 % -  05/24/17 1940 (!) 105/57 - - 71 11 98 % -  05/24/17 1925 (!) 114/56 - - 73 12 97 % -  05/24/17 1910 (!) 118/55 - - 70 12 97 % -  05/24/17 1855 (!) 113/57 - - 71 14 97 % -  05/24/17 1840 (!) 116/54 - - 70 12 99 % -  05/24/17 1825 (!) 122/54 - - 69 15 96 % -  05/24/17 1810 110/60 - - 69 10 100 % -  05/24/17 1755 122/62 - - 83 14 97 % -  05/24/17 1740 (!) 114/54 (!) 97.2 F (36.2 C) - 78 17 97 % -  05/24/17 1455 (!) 135/56 - - 81  17 99 % -  05/24/17 1325 - - - - - - 83.9 kg (185 lb)  05/24/17 1315 137/72 98.4 F (36.9 C) Oral 88 16 100 % 86.6 kg (191 lb)  .  RECENT EKG RESULTS:    Orders placed or performed during the hospital encounter of 04/05/17  . EKG 12 lead  . EKG 12 lead    DISCHARGE INSTRUCTIONS:    DISCHARGE MEDICATIONS:   Allergies as of 05/25/2017      Reactions   Valium [diazepam] Other (See Comments)   Urinary incontinence, stumbling, falling Pt does NOT want to have this medication again   Hydrocodone Other (See Comments)   Urinary incontinence, stumbling, falling   Oxycodone Other (See Comments)   Urinary incontinence, stumbling, falling   Penicillins Itching, Swelling, Other (See Comments)   > > > PATIENT HAS RECEIVED ANCEF MULTIPLE TIMES WITHOUT REACTION < < <     OK TO GIVE ANCEF, IF ORDERED, PER MD.  04/12/17 Arm swelling PATIENT HAD A PCN REACTION WITH IMMEDIATE RASH, FACIAL/TONGUE/THROAT SWELLING, SOB, OR LIGHTHEADEDNESS WITH HYPOTENSION:  #  #  #  YES  #  #  #   Has patient had a PCN reaction causing severe rash involving mucus membranes or skin necrosis: UNSPECIFIED   Has patient had a PCN reaction that required hospitalization No      Medication List    STOP taking these medications   diazepam 5 MG tablet Commonly known as:  VALIUM     TAKE these medications   acetaminophen 500 MG tablet Commonly known as:  TYLENOL Take 1,000 mg by mouth every 8 (eight) hours as needed for moderate pain.   ACIDOPHILUS/CITRUS PECTIN Tabs Take 2 tablets by mouth daily.   amLODipine 10 MG tablet Commonly known as:  NORVASC Take 10 mg by mouth daily.   anti-nausea solution Take 30 mLs by mouth daily as needed for nausea or vomiting (acid reflux).   buPROPion 150 MG 12 hr tablet Commonly known as:  WELLBUTRIN SR Take 150 mg by mouth every morning.   cetirizine 10 MG tablet Commonly known as:  ZYRTEC Take 10 mg by mouth every morning.   cholecalciferol 1000 units  tablet Commonly known as:  VITAMIN D Take 1,000 Units by mouth daily.   DULoxetine 30 MG capsule Commonly known as:  CYMBALTA Take 30 mg by mouth daily.  fluticasone 50 MCG/ACT nasal spray Commonly known as:  FLONASE Place 2 sprays into both nostrils 2 (two) times daily as needed for allergies or rhinitis.   hydrochlorothiazide 25 MG tablet Commonly known as:  HYDRODIURIL Take 25 mg by mouth every morning.   HYDROcodone-acetaminophen 5-325 MG tablet Commonly known as:  NORCO Take 1 tablet by mouth every 6 (six) hours as needed.   lisinopril 20 MG tablet Commonly known as:  PRINIVIL,ZESTRIL Take 20 mg by mouth 2 (two) times daily.   meclizine 25 MG tablet Commonly known as:  ANTIVERT Take 25 mg by mouth every 6 (six) hours as needed for dizziness.   omeprazole 20 MG tablet Commonly known as:  PRILOSEC OTC Take 20 mg by mouth 2 (two) times daily.   ondansetron 4 MG tablet Commonly known as:  ZOFRAN Take 1 tablet (4 mg total) by mouth every 8 (eight) hours as needed for nausea or vomiting.   pantoprazole 40 MG tablet Commonly known as:  PROTONIX Take 40 mg by mouth 2 (two) times daily.   polyethylene glycol packet Commonly known as:  MIRALAX / GLYCOLAX Take 17 g by mouth 2 (two) times daily. What changed:  when to take this   pravastatin 40 MG tablet Commonly known as:  PRAVACHOL Take 40 mg by mouth every morning.   traMADol 50 MG tablet Commonly known as:  ULTRAM Take 1-2 tablets (50-100 mg total) by mouth every 6 (six) hours as needed.   traZODone 50 MG tablet Commonly known as:  DESYREL Take 50 mg by mouth at bedtime. Sleep.   valACYclovir 500 MG tablet Commonly known as:  VALTREX TAKE 1 TABLET BY MOUTH TWICE DAILY  FOR 5 DAYS FOR OUTBREAKS       FOLLOW UP VISIT:   Follow-up Information    Francena Hanly, MD.   Specialty:  Orthopedic Surgery Why:  call to be seen in 10-14 days Contact information: [address] [place]  [address] [phone]           DISCHARGE TO: Home    DISCHARGE CONDITION:  Rodolph Bong for Dr. Francena Hanly 05/25/2017, 8:46 AM

## 2017-05-25 NOTE — Op Note (Signed)
NAME:  Nicole Stephenson, Nicole Stephenson                    ACCOUNT NO.:  MEDICAL RECORD NO.:  [id number]  LOCATION:                                 FACILITY:  PHYSICIAN:  Vania Rea. Crytal Pensinger, M.D.  DATE OF BIRTH:  [birth year]  DATE OF PROCEDURE:  05/24/2017 DATE OF DISCHARGE:                              OPERATIVE REPORT   PREOPERATIVE DIAGNOSIS:  Dislocated left reverse shoulder arthroplasty.  POSTOPERATIVE DIAGNOSIS:  Dislocated left reverse shoulder arthroplasty.  PROCEDURE:  Revision of left reverse shoulder arthroplasty converting the stem to a cemented construct utilizing the previous +6 extension and then converting to a +6 constrained polyethylene insert, maintaining the previous glenosphere.  SURGEON:  Vania Rea. Elwood Bazinet, M.D.  ASSISTANT:  Lucita Lora Shuford, PA-C.  ANESTHESIA:  General endotracheal as well as interscalene block.  ESTIMATED BLOOD LOSS:  150 mL.  DRAINS:  None.  HISTORY:  Nicole Stephenson is a 71 year old female, who had a previous left shoulder reverse arthroplasty that I performed almost 2 months ago.  Had an unfortunate fall early postop, seen back in the office with complaints of shoulder pain and found to have a dislocation of the implant.  She had been taken back to the operating room, where we performed a closed reduction.  At that time, felt as though the implant and construct was stable.  She was subsequently discharged home and unfortunately had several additional falls after leaving the hospital and upon return to the office earlier this week, again was found to have a dislocation of her implant with the humeral stem anterior to the glenosphere.  She is now brought to the operating room for planned surgical intervention with anticipated revision of the implant to improve stability.  Preoperatively, I counseled Nicole Stephenson regarding treatment options and potential risks versus benefits thereof.  Possible surgical complications were reviewed including bleeding, infection,  neurovascular injury, persistent pain, loss of motion, anesthetic complication, failure of the implant and possible need for additional surgery.  She understands and accepts and agrees with our plan.  PROCEDURE IN DETAIL:  After undergoing routine preop evaluation, the patient received prophylactic antibiotics and an interscalene block was established in the holding area by the Anesthesia Department.  Placed supine on the operating table.  Underwent smooth induction of a general endotracheal anesthesia.  Placed in beach-chair position and appropriately padded protected.  Left shoulder region was sterilely prepped and draped in standard fashion.  Time-out was called.  Anterior deltopectoral approach made through the previous incision, approximately 8 cm in length.  Skin flaps were elevated.  Dissection carried deeply. Skin flaps were elevated, and electrocautery was used for hemostasis. We identified the previous deltopectoral interval.  The vein was identified and taken laterally with the deltoid.  Conjoint tendon was identified and retracted medially.  At this point, we were able to reduce the implant and then we were able to excise significant scarified bursal tissue from the subdeltoid space and surrounding the implant.  Of note, the synovial fluid was all pristine in appearance.  There was some metallosis noted from the abrasion between the stem and the glenosphere. We performed a synovectomy of the stained areas.  At this  point, we then carefully inspected our humeral stem, and it indeed showed evidence for looseness.  Given these findings, we went ahead and removed the bony and soft-tissue attachments around the metaphysis of her humeral stem and we were able to remove the stem without difficulty.  At this point, we felt as though our glenosphere was stable and deficiency was within the humeral aspect as well as some insufficient tension of the soft tissues. Given these findings,  we elected to convert to a cemented construct.  We curetted the fibrous tissue from the metaphyseal region of the canal of the humerus and then hand reamed up to a 6 and then placed a 6 broach and then reamed proximally obtaining all fresh bony surfaces.  At this point, the canal was irrigated.  We then placed a distal cement plug at the appropriate level in the humeral shaft.  At this point, the canal was cleaned and dried.  We then mixed cement to the back table.  Cement was then introduced into the humeral canal and we seated our previous size 5 stem with the previous metaphysis to the appropriate depth and then carefully removed all extra cement.  This was allowed to harden. We obtained excellent stable fixation of the stem.  At this point, then we fastened the previously used +6 extension and on this, we then using trial reductions with various polyethylene inserts and constructs and ultimately felt that the +6 constrained poly gave Nicole Stephenson the best soft tissue balance.  We went ahead and impacted the final +6 constrained poly performed final reduction and once this was completed, then performed significant stressing of the shoulder and confirmed that it was indeed stable in all planes of motion.  At this point, the wound was then copiously irrigated.  Hemostasis was obtained.  The deltopectoral interval was then reapproximated with a series of figure-of-eight #1 Vicryl sutures.  2-0 Vicryl used for the subcu layer, intracuticular 3-0 Monocryl for the skin, followed by Dermabond and Aquacel dressing.  Left arm placed in a sling and the patient was awakened, extubated, and taken to the recovery room in stable condition.  [name], PA-C, was used as an Geophysicist/field seismologistassistant throughout this case, was essential for help with positioning the patient, positioning the extremity, tissue manipulation, suture management, implantation, prosthesis, wound closure, and intraoperative decision  making.     Vania ReaKevin M. Lindzie Boxx, M.D.     KMS/MEDQ  D:  05/24/2017  T:  05/24/2017  Job:  [id number]
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
## 2017-05-25 NOTE — Anesthesia Postprocedure Evaluation (Signed)
Anesthesia Post Note  Patient: Nicole Stephenson  Procedure(s) Performed: Procedure(s) (LRB): Revision left reverse shoulder arthroplasty (Left)     Anesthesia Post Evaluation  Last Vitals:  Vitals:   05/25/17 0000 05/25/17 0400  BP: 109/71 (!) 110/58  Pulse: 71 71  Resp: 18 18  Temp: 36.5 C (!) 36.3 C  SpO2: 100% 100%    Last Pain:  Vitals:   05/25/17 1020  TempSrc:   PainSc: 4                  Lewie LoronJohn Maycol Hoying
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
## 2017-05-26 DIAGNOSIS — I129 Hypertensive chronic kidney disease with stage 1 through stage 4 chronic kidney disease, or unspecified chronic kidney disease: Secondary | ICD-10-CM | POA: Diagnosis not present

## 2017-05-26 DIAGNOSIS — M1991 Primary osteoarthritis, unspecified site: Secondary | ICD-10-CM | POA: Diagnosis not present

## 2017-05-26 DIAGNOSIS — E669 Obesity, unspecified: Secondary | ICD-10-CM | POA: Diagnosis not present

## 2017-05-26 DIAGNOSIS — T84028D Dislocation of other internal joint prosthesis, subsequent encounter: Secondary | ICD-10-CM | POA: Diagnosis not present

## 2017-05-26 DIAGNOSIS — J45990 Exercise induced bronchospasm: Secondary | ICD-10-CM | POA: Diagnosis not present

## 2017-05-26 DIAGNOSIS — Z96612 Presence of left artificial shoulder joint: Secondary | ICD-10-CM | POA: Diagnosis not present

## 2017-06-06 DIAGNOSIS — Z471 Aftercare following joint replacement surgery: Secondary | ICD-10-CM | POA: Diagnosis not present

## 2017-06-06 DIAGNOSIS — Z96612 Presence of left artificial shoulder joint: Secondary | ICD-10-CM | POA: Diagnosis not present

## 2017-07-04 DIAGNOSIS — Z471 Aftercare following joint replacement surgery: Secondary | ICD-10-CM | POA: Diagnosis not present

## 2017-07-04 DIAGNOSIS — Z96612 Presence of left artificial shoulder joint: Secondary | ICD-10-CM | POA: Diagnosis not present

## 2017-07-11 ENCOUNTER — Other Ambulatory Visit: Payer: Self-pay | Admitting: Orthopedic Surgery

## 2017-07-11 DIAGNOSIS — Z471 Aftercare following joint replacement surgery: Secondary | ICD-10-CM

## 2017-07-19 ENCOUNTER — Ambulatory Visit
Admission: RE | Admit: 2017-07-19 | Discharge: 2017-07-19 | Disposition: A | Discharge status: Home / Self Care | Payer: Medicare Other | Source: Ambulatory Visit | Attending: Orthopedic Surgery | Admitting: Orthopedic Surgery

## 2017-07-19 DIAGNOSIS — S42122A Displaced fracture of acromial process, left shoulder, initial encounter for closed fracture: Secondary | ICD-10-CM | POA: Diagnosis not present

## 2017-07-19 DIAGNOSIS — Z471 Aftercare following joint replacement surgery: Secondary | ICD-10-CM

## 2017-08-09 DIAGNOSIS — E7849 Other hyperlipidemia: Secondary | ICD-10-CM | POA: Diagnosis not present

## 2017-08-09 DIAGNOSIS — I10 Essential (primary) hypertension: Secondary | ICD-10-CM | POA: Diagnosis not present

## 2017-08-09 DIAGNOSIS — M810 Age-related osteoporosis without current pathological fracture: Secondary | ICD-10-CM | POA: Diagnosis not present

## 2017-08-09 DIAGNOSIS — D649 Anemia, unspecified: Secondary | ICD-10-CM | POA: Diagnosis not present

## 2017-08-16 DIAGNOSIS — I10 Essential (primary) hypertension: Secondary | ICD-10-CM | POA: Diagnosis not present

## 2017-08-16 DIAGNOSIS — N183 Chronic kidney disease, stage 3 (moderate): Secondary | ICD-10-CM | POA: Diagnosis not present

## 2017-08-16 DIAGNOSIS — Z0000 Encounter for general adult medical examination without abnormal findings: Secondary | ICD-10-CM | POA: Diagnosis not present

## 2017-08-16 DIAGNOSIS — E668 Other obesity: Secondary | ICD-10-CM | POA: Diagnosis not present

## 2017-09-03 DIAGNOSIS — Z471 Aftercare following joint replacement surgery: Secondary | ICD-10-CM | POA: Diagnosis not present

## 2017-09-03 DIAGNOSIS — Z96612 Presence of left artificial shoulder joint: Secondary | ICD-10-CM | POA: Diagnosis not present

## 2018-02-14 DIAGNOSIS — K219 Gastro-esophageal reflux disease without esophagitis: Secondary | ICD-10-CM | POA: Diagnosis not present

## 2018-02-14 DIAGNOSIS — I10 Essential (primary) hypertension: Secondary | ICD-10-CM | POA: Diagnosis not present

## 2018-02-14 DIAGNOSIS — N183 Chronic kidney disease, stage 3 (moderate): Secondary | ICD-10-CM | POA: Diagnosis not present

## 2018-02-14 DIAGNOSIS — D649 Anemia, unspecified: Secondary | ICD-10-CM | POA: Diagnosis not present

## 2018-02-14 DIAGNOSIS — E668 Other obesity: Secondary | ICD-10-CM | POA: Diagnosis not present

## 2018-02-25 DIAGNOSIS — F418 Other specified anxiety disorders: Secondary | ICD-10-CM | POA: Diagnosis not present

## 2018-03-12 DIAGNOSIS — Z471 Aftercare following joint replacement surgery: Secondary | ICD-10-CM | POA: Diagnosis not present

## 2018-03-12 DIAGNOSIS — Z96612 Presence of left artificial shoulder joint: Secondary | ICD-10-CM | POA: Diagnosis not present

## 2018-03-12 DIAGNOSIS — M19012 Primary osteoarthritis, left shoulder: Secondary | ICD-10-CM | POA: Diagnosis not present

## 2018-04-05 DIAGNOSIS — M1711 Unilateral primary osteoarthritis, right knee: Secondary | ICD-10-CM | POA: Diagnosis not present

## 2018-04-05 DIAGNOSIS — M25561 Pain in right knee: Secondary | ICD-10-CM | POA: Diagnosis not present

## 2018-05-16 DIAGNOSIS — Z6831 Body mass index (BMI) 31.0-31.9, adult: Secondary | ICD-10-CM | POA: Diagnosis not present

## 2018-05-16 DIAGNOSIS — R8290 Unspecified abnormal findings in urine: Secondary | ICD-10-CM | POA: Diagnosis not present

## 2018-05-16 DIAGNOSIS — I10 Essential (primary) hypertension: Secondary | ICD-10-CM | POA: Diagnosis not present

## 2018-05-16 DIAGNOSIS — Z01818 Encounter for other preprocedural examination: Secondary | ICD-10-CM | POA: Diagnosis not present

## 2018-05-16 DIAGNOSIS — N390 Urinary tract infection, site not specified: Secondary | ICD-10-CM | POA: Diagnosis not present

## 2018-05-20 DIAGNOSIS — I10 Essential (primary) hypertension: Secondary | ICD-10-CM | POA: Diagnosis not present

## 2018-05-24 NOTE — Progress Notes (Signed)
Spoke to Dr. Richardson LandryHouser, MDA ,face to face about patient's EKG from 05/16/2018 and reviewed patient's medical history with Dr. Richardson LandryHouser. Per Dr. Richardson LandryHouser, repeat EKG day of pre-op.

## 2018-05-27 ENCOUNTER — Other Ambulatory Visit: Payer: Self-pay

## 2018-05-27 ENCOUNTER — Encounter (HOSPITAL_COMMUNITY): Payer: Self-pay

## 2018-05-27 ENCOUNTER — Encounter (HOSPITAL_COMMUNITY)
Admission: RE | Admit: 2018-05-27 | Discharge: 2018-05-27 | Disposition: A | Discharge status: Home / Self Care | Payer: Medicare Other | Source: Ambulatory Visit | Attending: Orthopedic Surgery | Admitting: Orthopedic Surgery

## 2018-05-27 DIAGNOSIS — Z01812 Encounter for preprocedural laboratory examination: Secondary | ICD-10-CM | POA: Diagnosis present

## 2018-05-27 DIAGNOSIS — I10 Essential (primary) hypertension: Secondary | ICD-10-CM | POA: Insufficient documentation

## 2018-05-27 DIAGNOSIS — Z01810 Encounter for preprocedural cardiovascular examination: Secondary | ICD-10-CM | POA: Diagnosis present

## 2018-05-27 LAB — CBC
HCT: 35.6 % — ABNORMAL LOW (ref 36.0–46.0)
Hemoglobin: 11.7 g/dL — ABNORMAL LOW (ref 12.0–15.0)
MCH: 30.3 pg (ref 26.0–34.0)
MCHC: 32.9 g/dL (ref 30.0–36.0)
MCV: 92.2 fL (ref 78.0–100.0)
PLATELETS: 242 10*3/uL (ref 150–400)
RBC: 3.86 MIL/uL — ABNORMAL LOW (ref 3.87–5.11)
RDW: 15 % (ref 11.5–15.5)
WBC: 6.4 10*3/uL (ref 4.0–10.5)

## 2018-05-27 LAB — SURGICAL PCR SCREEN
MRSA, PCR: NEGATIVE
Staphylococcus aureus: NEGATIVE

## 2018-05-27 LAB — BASIC METABOLIC PANEL
Anion gap: 9 (ref 5–15)
BUN: 24 mg/dL — AB (ref 8–23)
CHLORIDE: 99 mmol/L (ref 98–111)
CO2: 27 mmol/L (ref 22–32)
CREATININE: 1.54 mg/dL — AB (ref 0.44–1.00)
Calcium: 9.7 mg/dL (ref 8.9–10.3)
GFR calc Af Amer: 38 mL/min — ABNORMAL LOW (ref >60)
GFR calc non Af Amer: 33 mL/min — ABNORMAL LOW (ref >60)
GLUCOSE: 113 mg/dL — AB (ref 70–99)
Potassium: 3.5 mmol/L (ref 3.5–5.1)
Sodium: 135 mmol/L (ref 135–145)

## 2018-05-27 NOTE — Patient Instructions (Addendum)
Nicole Stephenson  05/27/2018   Your procedure is scheduled on: Tuesday 06/04/2018  Report to [facility] Main  Entrance              Report to admitting at  0530   AM    Call this number if you have problems the morning of surgery [phone]    Remember: Do not eat food or drink liquids :After Midnight.      Take these medicines the morning of surgery with A SIP OF WATER: Amlodipine (Norvasc), Bupropion (WellbutrinSR), Duloxetine (Cymbalta), Pantoprazole (Protonix), Pravastatin (Pravachol)                                You may not have any metal on your body including hair pins and              piercings  Do not wear jewelry, make-up, lotions, powders or perfumes, deodorant             Do not wear nail polish.  Do not shave  48 hours prior to surgery.               Do not bring valuables to the hospital. [facility] IS NOT             RESPONSIBLE   FOR VALUABLES.  Contacts, dentures or bridgework may not be worn into surgery.  Leave suitcase in the car. After surgery it may be brought to your room.                  Please read over the following fact sheets you were given: _____________________________________________________________________             [facility] - Preparing for [facility] Before surgery, you can play an important role.  Because skin is not sterile, your skin needs to be as free of germs as possible.  You can reduce the number of germs on your skin by washing with CHG (chlorahexidine gluconate) soap before surgery.  CHG is an antiseptic cleaner which kills germs and bonds with the skin to continue killing germs even after washing. Please DO NOT use if you have an allergy to CHG or antibacterial soaps.  If your skin becomes reddened/irritated stop using the CHG and inform your nurse when you arrive at Short Stay. Do not shave (including legs and underarms) for at least 48 hours prior to the first CHG shower.  You may shave your  face/neck. Please follow these instructions carefully:  1.  Shower with CHG Soap the night before surgery and the  morning of Surgery.  2.  If you choose to wash your hair, wash your hair first as usual with your  normal  shampoo.  3.  After you shampoo, rinse your hair and body thoroughly to remove the  shampoo.                           4.  Use CHG as you would any other liquid soap.  You can apply chg directly  to the skin and wash                       Gently with a scrungie or clean washcloth.  5.  Apply the CHG Soap to your body ONLY FROM  THE NECK DOWN.   Do not use on face/ open                           Wound or open sores. Avoid contact with eyes, ears mouth and genitals (private parts).                       Wash face,  Genitals (private parts) with your normal soap.             6.  Wash thoroughly, paying special attention to the area where your surgery  will be performed.  7.  Thoroughly rinse your body with warm water from the neck down.  8.  DO NOT shower/wash with your normal soap after using and rinsing off  the CHG Soap.                9.  Pat yourself dry with a clean towel.            10.  Wear clean pajamas.            11.  Place clean sheets on your bed the night of your first shower and do not  sleep with pets. Day of Surgery : Do not apply any lotions/deodorants the morning of surgery.  Please wear clean clothes to the hospital/surgery center.  FAILURE TO FOLLOW THESE INSTRUCTIONS MAY RESULT IN THE CANCELLATION OF YOUR SURGERY PATIENT SIGNATURE_________________________________  NURSE SIGNATURE__________________________________  ________________________________________________________________________   [name]  An incentive spirometer is a tool that can help keep your lungs clear and active. This tool measures how well you are filling your lungs with each breath. Taking long deep breaths may help reverse or decrease the chance of developing breathing  (pulmonary) problems (especially infection) following:  A long period of time when you are unable to move or be active. BEFORE THE PROCEDURE   If the spirometer includes an indicator to show your best effort, your nurse or respiratory therapist will set it to a desired goal.  If possible, sit up straight or lean slightly forward. Try not to slouch.  Hold the incentive spirometer in an upright position. INSTRUCTIONS FOR USE  1. Sit on the edge of your bed if possible, or sit up as far as you can in bed or on a chair. 2. Hold the incentive spirometer in an upright position. 3. Breathe out normally. 4. Place the mouthpiece in your mouth and seal your lips tightly around it. 5. Breathe in slowly and as deeply as possible, raising the piston or the ball toward the top of the column. 6. Hold your breath for 3-5 seconds or for as long as possible. Allow the piston or ball to fall to the bottom of the column. 7. Remove the mouthpiece from your mouth and breathe out normally. 8. Rest for a few seconds and repeat Steps 1 through 7 at least 10 times every 1-2 hours when you are awake. Take your time and take a few normal breaths between deep breaths. 9. The spirometer may include an indicator to show your best effort. Use the indicator as a goal to work toward during each repetition. 10. After each set of 10 deep breaths, practice coughing to be sure your lungs are clear. If you have an incision (the cut made at the time of surgery), support your incision when coughing by placing a pillow or rolled up towels firmly against it. Once you are  able to get out of bed, walk around indoors and cough well. You may stop using the incentive spirometer when instructed by your caregiver.  RISKS AND COMPLICATIONS  Take your time so you do not get dizzy or light-headed.  If you are in pain, you may need to take or ask for pain medication before doing incentive spirometry. It is harder to take a deep breath if you  are having pain. AFTER USE  Rest and breathe slowly and easily.  It can be helpful to keep track of a log of your progress. Your caregiver can provide you with a simple table to help with this. If you are using the spirometer at home, follow these instructions: [place] IF:   You are having difficultly using the spirometer.  You have trouble using the spirometer as often as instructed.  Your pain medication is not giving enough relief while using the spirometer.  You develop fever of 100.5 F (38.1 C) or higher. SEEK IMMEDIATE MEDICAL CARE IF:   You cough up bloody sputum that had not been present before.  You develop fever of 102 F (38.9 C) or greater.  You develop worsening pain at or near the incision site. MAKE SURE YOU:   Understand these instructions.  Will watch your condition.  Will get help right away if you are not doing well or get worse. Document Released: 02/12/2007 Document Revised: 12/25/2011 Document Reviewed: 04/15/2007 Exit[employer] Patient Information 2014 [employer], [place].   ________________________________________________________________________  WHAT IS A BLOOD TRANSFUSION? Blood Transfusion Information  A transfusion is the replacement of blood or some of its parts. Blood is made up of multiple cells which provide different functions.  Red blood cells carry oxygen and are used for blood loss replacement.  White blood cells fight against infection.  Platelets control bleeding.  Plasma helps clot blood.  Other blood products are available for specialized needs, such as hemophilia or other clotting disorders. BEFORE THE TRANSFUSION  Who gives blood for transfusions?   Healthy volunteers who are fully evaluated to make sure their blood is safe. This is blood bank blood. Transfusion therapy is the safest it has ever been in the practice of medicine. Before blood is taken from a donor, a complete history is taken to make sure that person has  no history of diseases nor engages in risky social behavior (examples are intravenous drug use or sexual activity with multiple partners). The donor's travel history is screened to minimize risk of transmitting infections, such as malaria. The donated blood is tested for signs of infectious diseases, such as HIV and hepatitis. The blood is then tested to be sure it is compatible with you in order to minimize the chance of a transfusion reaction. If you or a relative donates blood, this is often done in anticipation of surgery and is not appropriate for emergency situations. It takes many days to process the donated blood. RISKS AND COMPLICATIONS Although transfusion therapy is very safe and saves many lives, the main dangers of transfusion include:   Getting an infectious disease.  Developing a transfusion reaction. This is an allergic reaction to something in the blood you were given. Every precaution is taken to prevent this. The decision to have a blood transfusion has been considered carefully by your caregiver before blood is given. Blood is not given unless the benefits outweigh the risks. AFTER THE TRANSFUSION  Right after receiving a blood transfusion, you will usually feel much better and more energetic. This is especially  true if your red blood cells have gotten low (anemic). The transfusion raises the level of the red blood cells which carry oxygen, and this usually causes an energy increase.  The nurse administering the transfusion will monitor you carefully for complications. HOME CARE INSTRUCTIONS  No special instructions are needed after a transfusion. You may find your energy is better. Speak with your caregiver about any limitations on activity for underlying diseases you may have. SEEK MEDICAL CARE IF:   Your condition is not improving after your transfusion.  You develop redness or irritation at the intravenous (IV) site. SEEK IMMEDIATE MEDICAL CARE IF:  Any of the following  symptoms occur over the next 12 hours:  Shaking chills.  You have a temperature by mouth above 102 F (38.9 C), not controlled by medicine.  Chest, back, or muscle pain.  People around you feel you are not acting correctly or are confused.  Shortness of breath or difficulty breathing.  Dizziness and fainting.  You get a rash or develop hives.  You have a decrease in urine output.  Your urine turns a dark color or changes to pink, red, or brown. Any of the following symptoms occur over the next 10 days:  You have a temperature by mouth above 102 F (38.9 C), not controlled by medicine.  Shortness of breath.  Weakness after normal activity.  The white part of the eye turns yellow (jaundice).  You have a decrease in the amount of urine or are urinating less often.  Your urine turns a dark color or changes to pink, red, or brown. Document Released: 09/29/2000 Document Revised: 12/25/2011 Document Reviewed: 05/18/2008 [facility] Patient Information 2014 [place], [place].  _______________________________________________________________________

## 2018-05-27 NOTE — H&P (Signed)
TOTAL KNEE ADMISSION H&P  Patient is being admitted for right total knee arthroplasty.  Subjective:  Chief Complaint:  Right knee primary OA / pain, possibly leaving the patella  HPI: Nicole Stephenson, 72 y.o. female, has a history of pain and functional disability in the right knee due to arthritis and has failed non-surgical conservative treatments for greater than 12 weeks to includeNSAID's and/or analgesics, corticosteriod injections, viscosupplementation injections and activity modification.  Onset of symptoms was gradual, starting 14 years ago with gradually worsening course since that time. The patient noted prior procedures on the knee to include  arthroplasty on the left knee in April 2017.  Patient currently rates pain in the right knee(s) at 9 out of 10 with activity. Patient has night pain, worsening of pain with activity and weight bearing, pain that interferes with activities of daily living, pain with passive range of motion, crepitus and joint swelling.  Patient has evidence of periarticular osteophytes and joint space narrowing by imaging studies. There is no active infection.  Risks, benefits and expectations were discussed with the patient.  Risks including but not limited to the risk of anesthesia, blood clots, nerve damage, blood vessel damage, failure of the prosthesis, infection and up to and including death.  Patient understand the risks, benefits and expectations and wishes to proceed with surgery.   PCP: Chilton Greathouse, MD  D/C Plans:       Home   Post-op Meds:       No Rx given   Tranexamic Acid:      To be given -  topically  Decadron:      Is  to be given  FYI:     Xarelto then ASA  Norco  Need a Zofran Rx for home  DME:   Pt already has equipment  PT:   OPPT    Patient Active Problem List   Diagnosis Date Noted  . 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 Obese 02/09/2016  . S/P left TKA 02/08/2016   Past Medical History:  Diagnosis Date   . Allergic rhinitis, seasonal   . Anemia   . Anxiety   . Arthritis   . Asthma    exercise induced  . Chronic kidney disease    Stage III  . Constipation   . Depression   . DJD (degenerative joint disease)   . DVT (deep venous thrombosis) (HCC) 1997  . Dyspnea    with exertion  . GERD (gastroesophageal reflux disease)   . 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 History of kidney stones    passed x 2  . Hypertension   . Pneumonia     Past Surgical History:  Procedure Laterality Date  . bottom of left foot for keloids - 1993    . COLONOSCOPY    . REVERSE SHOULDER ARTHROPLASTY Left 04/12/2017   Procedure: REVERSE SHOULDER ARTHROPLASTY;  Surgeon: Francena Hanly, MD;  Location: [facility] OR;  Service: Orthopedics;  Laterality: Left;  . REVISION TOTAL SHOULDER TO REVERSE TOTAL SHOULDER Left 05/24/2017   Procedure: Revision left reverse shoulder arthroplasty;  Surgeon: Francena Hanly, MD;  Location: [facility] OR;  Service: Orthopedics;  Laterality: Left;  . right foot surgery ,rod inserted in lateral side of foot    . SHOULDER CLOSED REDUCTION Left 05/03/2017   Procedure: Closed reduction left shoulder ;  Surgeon: Francena Hanly, MD;  Location: [facility] OR;  Service: Orthopedics;  Laterality: Left;  . TOTAL KNEE ARTHROPLASTY Left 02/08/2016   Procedure:  LEFT TOTAL KNEE ARTHROPLASTY;  Surgeon: Durene RomansMatthew Olin, MD;  Location: [facility] ORS;  Service: Orthopedics;  Laterality: Left;    No current facility-administered medications for this encounter.    Current Outpatient Medications  Medication Sig Dispense Refill Last Dose  . acetaminophen (TYLENOL) 500 MG tablet Take 1,000 mg by mouth every 8 (eight) hours as needed for moderate pain.   Past Week at Unknown time  . amLODipine (NORVASC) 10 MG tablet Take 10 mg by mouth daily.  3 05/24/2017 at Unknown time  . buPROPion (WELLBUTRIN SR) 150 MG 12 hr tablet Take 150 mg by mouth every morning.  2 05/24/2017 at Unknown time  . cetirizine (ZYRTEC) 10 MG tablet Take 10 mg by mouth every morning.    05/24/2017 at Unknown time  . DULoxetine (CYMBALTA) 30 MG capsule Take 30 mg by mouth daily.  11 05/24/2017 at Unknown time  . fluticasone (FLONASE) 50 MCG/ACT nasal spray Place 2 sprays into both nostrils 2 (two) times daily as needed for allergies or rhinitis.   Unknown at Unknown time  . hydrochlorothiazide (HYDRODIURIL) 25 MG tablet Take 25 mg by mouth every morning.   05/23/2017 at Unknown time  . irbesartan (AVAPRO) 150 MG tablet Take 150 mg by mouth daily.     . meclizine (ANTIVERT) 25 MG tablet Take 25 mg by mouth every 6 (six) hours as needed for dizziness.   Unknown at Unknown time  . Melatonin 10 MG TABS Take 10 mg by mouth at bedtime.     . pantoprazole (PROTONIX) 40 MG tablet Take 40 mg by mouth 2 (two) times daily.   05/24/2017 at Unknown time  . pravastatin (PRAVACHOL) 40 MG tablet Take 40 mg by mouth every morning.   05/23/2017 at Unknown time  . traZODone (DESYREL) 50 MG tablet Take 50 mg by mouth at bedtime. Sleep.   3 05/23/2017 at Unknown time  . valACYclovir (VALTREX) 500 MG tablet TAKE 1 TABLET BY MOUTH TWICE DAILY AS NEEDED FOR 5 DAYS FOR OUTBREAKS  2 Unknown at Unknown time   Allergies  Allergen Reactions  . Valium [Diazepam] Other (See Comments)    Urinary incontinence, stumbling, falling Pt does NOT want to have this medication again  . Hydrocodone Other (See Comments)    Urinary incontinence, stumbling, falling  . Oxycodone Other (See Comments)    Urinary incontinence, stumbling, falling  . Penicillins Itching, Swelling and Other (See Comments)    > > > PATIENT HAS RECEIVED ANCEF MULTIPLE TIMES WITHOUT REACTION < < <     OK TO GIVE ANCEF, IF ORDERED, PER MD.  04/12/17 Arm swelling PATIENT HAD A PCN REACTION WITH IMMEDIATE RASH, FACIAL/TONGUE/THROAT SWELLING, SOB, OR LIGHTHEADEDNESS WITH HYPOTENSION:  #  #  #  YES  #  #  #   Has patient had a PCN reaction causing severe rash involving mucus membranes or skin necrosis: UNSPECIFIED   Has patient had a PCN reaction that  required hospitalization No  . Lisinopril Cough    Causes Coughing    Social History   Tobacco Use  . Smoking status: Never Smoker  . Smokeless tobacco: Never Used  Substance Use Topics  . Alcohol use: No    Family History  Problem Relation Age of Onset  . Hypertension Mother   . Heart disease Mother   . Stroke Mother   . Hypertension Father      Review of Systems  Constitutional: Negative.   HENT: Negative.   Eyes: Negative.   Respiratory:  Negative.   Cardiovascular: Negative.   Gastrointestinal: Positive for constipation and heartburn.  Genitourinary: Negative.   Musculoskeletal: Positive for joint pain.  Skin: Negative.   Neurological: Negative.   Endo/Heme/Allergies: Positive for environmental allergies.  Psychiatric/Behavioral: Positive for depression. The patient is nervous/anxious.     Objective:  Physical Exam  Constitutional: She is oriented to person, place, and time. She appears well-developed.  HENT:  Head: Normocephalic.  Eyes: Pupils are equal, round, and reactive to light.  Neck: Neck supple. No JVD present. No tracheal deviation present. No thyromegaly present.  Cardiovascular: Normal rate, regular rhythm and intact distal pulses.  Respiratory: Effort normal and breath sounds normal. No respiratory distress. She has no wheezes.  GI: Soft. There is no tenderness. There is no guarding.  Musculoskeletal:       Right knee: She exhibits decreased range of motion, swelling, abnormal patellar mobility and bony tenderness. She exhibits no ecchymosis, no deformity, no laceration and no erythema. Tenderness found.  Lymphadenopathy:    She has no cervical adenopathy.  Neurological: She is alert and oriented to person, place, and time.  Skin: Skin is warm and dry.  Psychiatric: She has a normal mood and affect.      Labs:  Estimated body mass index is 30.32 kg/m as calculated from the following:   Height as of 04/05/17: 5' 5.5" (1.664 m).   Weight as  of 05/24/17: 83.9 kg.   Imaging Review Plain radiographs demonstrate severe degenerative joint disease of the right knee(s).  The bone quality appears to be good for age and reported activity level.   Preoperative templating of the joint replacement has been completed, documented, and submitted to the Operating Room personnel in order to optimize intra-operative equipment management.   Anticipated LOS equal to or greater than 2 midnights due to - Age 72 and older with one or more of the following:  - Obesity  - Expected need for hospital services (PT, OT, Nursing) required for safe  discharge  - Anticipated need for postoperative skilled nursing care or inpatient rehab  - Active co-morbidities: DVT/VTE and CKD    Assessment/Plan:  End stage arthritis, right knee   The patient history, physical examination, clinical judgment of the provider and imaging studies are consistent with end stage degenerative joint disease of the right knee(s) and total knee arthroplasty is deemed medically necessary. The treatment options including medical management, injection therapy arthroscopy and arthroplasty were discussed at length. The risks and benefits of total knee arthroplasty were presented and reviewed. The risks due to aseptic loosening, infection, stiffness, patella tracking problems, thromboembolic complications and other imponderables were discussed. The patient acknowledged the explanation, agreed to proceed with the plan and consent was signed. Patient is being admitted for inpatient treatment for surgery, pain control, PT, OT, prophylactic antibiotics, VTE prophylaxis, progressive ambulation and ADL's and discharge planning. The patient is planning to be discharged home.     Anastasio AuerbachMatthew S. Alaja Goldinger   PA-C  05/27/2018, 11:46 AM

## 2018-06-03 NOTE — Anesthesia Preprocedure Evaluation (Addendum)
Anesthesia Evaluation  Patient identified by MRN, date of birth, ID band Patient awake    Reviewed: Allergy & Precautions, NPO status , Patient's Chart, lab work & pertinent test results  Airway Mallampati: II  TM Distance: >3 FB Neck ROM: Full    Dental no notable dental hx. (+) Teeth Intact, Dental Advisory Given   Pulmonary asthma ,    Pulmonary exam normal breath sounds clear to auscultation       Cardiovascular Exercise Tolerance: Good hypertension, Pt. on medications Normal cardiovascular exam Rhythm:Regular Rate:Normal     Neuro/Psych Anxiety    GI/Hepatic Neg liver ROS, GERD  ,  Endo/Other  negative endocrine ROS  Renal/GU Renal disease     Musculoskeletal  (+) Arthritis ,   Abdominal   Peds  Hematology  (+) anemia ,   Anesthesia Other Findings R TKA  Reproductive/Obstetrics                            Lab Results  Component Value Date   WBC 6.4 05/27/2018   HGB 11.7 (L) 05/27/2018   HCT 35.6 (L) 05/27/2018   MCV 92.2 05/27/2018   PLT 242 05/27/2018    Anesthesia Physical Anesthesia Plan  ASA: III  Anesthesia Plan: Regional and Spinal   Post-op Pain Management:    Induction:   PONV Risk Score and Plan: 2 and Treatment may vary due to age or medical condition  Airway Management Planned: Natural Airway and Nasal Cannula  Additional Equipment:   Intra-op Plan:   Post-operative Plan: Extubation in OR  Informed Consent: I have reviewed the patients History and Physical, chart, labs and discussed the procedure including the risks, benefits and alternatives for the proposed anesthesia with the patient or authorized representative who has indicated his/her understanding and acceptance.   Dental advisory given  Plan Discussed with: CRNA  Anesthesia Plan Comments:         Anesthesia Quick Evaluation

## 2018-06-04 ENCOUNTER — Other Ambulatory Visit: Payer: Self-pay

## 2018-06-04 ENCOUNTER — Inpatient Hospital Stay (HOSPITAL_COMMUNITY)
Admission: RE | Admit: 2018-06-04 | Discharge: 2018-06-05 | DRG: 470 | Disposition: A | Discharge status: Home / Self Care | Payer: Medicare Other | Source: Ambulatory Visit | Attending: Orthopedic Surgery | Admitting: Orthopedic Surgery

## 2018-06-04 ENCOUNTER — Encounter (HOSPITAL_COMMUNITY): Payer: Self-pay | Admitting: *Deleted

## 2018-06-04 ENCOUNTER — Inpatient Hospital Stay (HOSPITAL_COMMUNITY): Payer: Medicare Other | Admitting: Anesthesiology

## 2018-06-04 ENCOUNTER — Encounter (HOSPITAL_COMMUNITY)
Admission: RE | Disposition: A | Discharge status: Home / Self Care | Payer: Self-pay | Source: Ambulatory Visit | Attending: Orthopedic Surgery

## 2018-06-04 DIAGNOSIS — E1122 Type 2 diabetes mellitus with diabetic chronic kidney disease: Secondary | ICD-10-CM | POA: Diagnosis not present

## 2018-06-04 DIAGNOSIS — N183 Chronic kidney disease, stage 3 (moderate): Secondary | ICD-10-CM | POA: Diagnosis present

## 2018-06-04 DIAGNOSIS — E663 Overweight: Secondary | ICD-10-CM | POA: Diagnosis present

## 2018-06-04 DIAGNOSIS — Z8249 Family history of ischemic heart disease and other diseases of the circulatory system: Secondary | ICD-10-CM

## 2018-06-04 DIAGNOSIS — Z86718 Personal history of other venous thrombosis and embolism: Secondary | ICD-10-CM | POA: Diagnosis not present

## 2018-06-04 DIAGNOSIS — J449 Chronic obstructive pulmonary disease, unspecified: Secondary | ICD-10-CM | POA: Diagnosis not present

## 2018-06-04 DIAGNOSIS — M25761 Osteophyte, right knee: Secondary | ICD-10-CM | POA: Diagnosis not present

## 2018-06-04 DIAGNOSIS — Z880 Allergy status to penicillin: Secondary | ICD-10-CM

## 2018-06-04 DIAGNOSIS — I129 Hypertensive chronic kidney disease with stage 1 through stage 4 chronic kidney disease, or unspecified chronic kidney disease: Secondary | ICD-10-CM | POA: Diagnosis not present

## 2018-06-04 DIAGNOSIS — M659 Synovitis and tenosynovitis, unspecified: Secondary | ICD-10-CM | POA: Diagnosis not present

## 2018-06-04 DIAGNOSIS — Z7951 Long term (current) use of inhaled steroids: Secondary | ICD-10-CM | POA: Diagnosis not present

## 2018-06-04 DIAGNOSIS — Z96652 Presence of left artificial knee joint: Secondary | ICD-10-CM | POA: Diagnosis not present

## 2018-06-04 DIAGNOSIS — K219 Gastro-esophageal reflux disease without esophagitis: Secondary | ICD-10-CM | POA: Diagnosis not present

## 2018-06-04 DIAGNOSIS — Z96659 Presence of unspecified artificial knee joint: Secondary | ICD-10-CM

## 2018-06-04 DIAGNOSIS — Z96612 Presence of left artificial shoulder joint: Secondary | ICD-10-CM | POA: Diagnosis present

## 2018-06-04 DIAGNOSIS — F419 Anxiety disorder, unspecified: Secondary | ICD-10-CM | POA: Diagnosis not present

## 2018-06-04 DIAGNOSIS — Z888 Allergy status to other drugs, medicaments and biological substances status: Secondary | ICD-10-CM

## 2018-06-04 DIAGNOSIS — Z885 Allergy status to narcotic agent status: Secondary | ICD-10-CM

## 2018-06-04 DIAGNOSIS — Z6829 Body mass index (BMI) 29.0-29.9, adult: Secondary | ICD-10-CM

## 2018-06-04 DIAGNOSIS — J309 Allergic rhinitis, unspecified: Secondary | ICD-10-CM | POA: Diagnosis not present

## 2018-06-04 DIAGNOSIS — B009 Herpesviral infection, unspecified: Secondary | ICD-10-CM | POA: Diagnosis present

## 2018-06-04 DIAGNOSIS — E669 Obesity, unspecified: Secondary | ICD-10-CM | POA: Diagnosis not present

## 2018-06-04 DIAGNOSIS — Z87442 Personal history of urinary calculi: Secondary | ICD-10-CM

## 2018-06-04 DIAGNOSIS — Z79899 Other long term (current) drug therapy: Secondary | ICD-10-CM

## 2018-06-04 DIAGNOSIS — G8918 Other acute postprocedural pain: Secondary | ICD-10-CM | POA: Diagnosis not present

## 2018-06-04 DIAGNOSIS — F329 Major depressive disorder, single episode, unspecified: Secondary | ICD-10-CM | POA: Diagnosis not present

## 2018-06-04 DIAGNOSIS — K5900 Constipation, unspecified: Secondary | ICD-10-CM | POA: Diagnosis not present

## 2018-06-04 DIAGNOSIS — M1711 Unilateral primary osteoarthritis, right knee: Secondary | ICD-10-CM | POA: Diagnosis not present

## 2018-06-04 DIAGNOSIS — Z823 Family history of stroke: Secondary | ICD-10-CM

## 2018-06-04 DIAGNOSIS — Z79890 Hormone replacement therapy (postmenopausal): Secondary | ICD-10-CM | POA: Diagnosis not present

## 2018-06-04 DIAGNOSIS — Z96651 Presence of right artificial knee joint: Secondary | ICD-10-CM

## 2018-06-04 HISTORY — PX: TOTAL KNEE ARTHROPLASTY: SHX125

## 2018-06-04 LAB — TYPE AND SCREEN
ABO/RH(D): A POS
ANTIBODY SCREEN: NEGATIVE

## 2018-06-04 SURGERY — ARTHROPLASTY, KNEE, TOTAL
Anesthesia: Regional | Site: Knee | Laterality: Right

## 2018-06-04 MED ORDER — LACTATED RINGERS IV SOLN
INTRAVENOUS | Status: DC
  Administered 2018-06-04 (×2): via INTRAVENOUS

## 2018-06-04 MED ORDER — FERROUS SULFATE 325 (65 FE) MG PO TABS: 325.0000 mg | ORAL_TABLET | Freq: Three times a day (TID) | ORAL | 3 refills | Status: AC

## 2018-06-04 MED ORDER — KETOROLAC TROMETHAMINE 30 MG/ML IJ SOLN
INTRAMUSCULAR | Status: AC
  Filled 2018-06-04: qty 1

## 2018-06-04 MED ORDER — METOCLOPRAMIDE HCL 5 MG/ML IJ SOLN: 5.0000 mg | Freq: Three times a day (TID) | INTRAMUSCULAR | Status: DC | PRN

## 2018-06-04 MED ORDER — MAGNESIUM CITRATE PO SOLN: 1.0000 | Freq: Once | ORAL | Status: DC | PRN

## 2018-06-04 MED ORDER — BUPROPION HCL ER (SR) 150 MG PO TB12
150.0000 mg | ORAL_TABLET | Freq: Every morning | ORAL | Status: DC
  Administered 2018-06-05: 150 mg via ORAL
  Filled 2018-06-04: qty 1

## 2018-06-04 MED ORDER — FENTANYL CITRATE (PF) 100 MCG/2ML IJ SOLN
INTRAMUSCULAR | Status: AC
  Filled 2018-06-04: qty 2

## 2018-06-04 MED ORDER — DEXAMETHASONE SODIUM PHOSPHATE 10 MG/ML IJ SOLN
10.0000 mg | Freq: Once | INTRAMUSCULAR | Status: AC
  Administered 2018-06-04: 10 mg via INTRAVENOUS

## 2018-06-04 MED ORDER — ALUM & MAG HYDROXIDE-SIMETH 200-200-20 MG/5ML PO SUSP: 15.0000 mL | ORAL | Status: DC | PRN

## 2018-06-04 MED ORDER — MELATONIN 5 MG PO TABS
10.0000 mg | ORAL_TABLET | Freq: Every day | ORAL | Status: DC
  Administered 2018-06-04: 10 mg via ORAL
  Filled 2018-06-04: qty 2

## 2018-06-04 MED ORDER — RIVAROXABAN 10 MG PO TABS
10.0000 mg | ORAL_TABLET | ORAL | Status: DC
  Administered 2018-06-05: 10 mg via ORAL
  Filled 2018-06-04: qty 1

## 2018-06-04 MED ORDER — CHLORHEXIDINE GLUCONATE 4 % EX LIQD: 60.0000 mL | Freq: Once | CUTANEOUS | Status: DC

## 2018-06-04 MED ORDER — EPHEDRINE SULFATE-NACL 50-0.9 MG/10ML-% IV SOSY
PREFILLED_SYRINGE | INTRAVENOUS | Status: DC | PRN
  Administered 2018-06-04: 10 mg via INTRAVENOUS
  Administered 2018-06-04: 5 mg via INTRAVENOUS
  Administered 2018-06-04 (×3): 10 mg via INTRAVENOUS
  Administered 2018-06-04: 5 mg via INTRAVENOUS

## 2018-06-04 MED ORDER — METHOCARBAMOL 500 MG PO TABS: 500.0000 mg | ORAL_TABLET | Freq: Four times a day (QID) | ORAL | 0 refills | Status: DC | PRN

## 2018-06-04 MED ORDER — PRAVASTATIN SODIUM 20 MG PO TABS
40.0000 mg | ORAL_TABLET | Freq: Every morning | ORAL | Status: DC
  Administered 2018-06-05: 40 mg via ORAL
  Filled 2018-06-04: qty 2
  Filled 2018-06-04: qty 1

## 2018-06-04 MED ORDER — MIDAZOLAM HCL 5 MG/5ML IJ SOLN
INTRAMUSCULAR | Status: DC | PRN
  Administered 2018-06-04 (×2): 1 mg via INTRAVENOUS

## 2018-06-04 MED ORDER — MECLIZINE HCL 25 MG PO TABS: 25.0000 mg | ORAL_TABLET | Freq: Four times a day (QID) | ORAL | Status: DC | PRN

## 2018-06-04 MED ORDER — AMLODIPINE BESYLATE 10 MG PO TABS
10.0000 mg | ORAL_TABLET | Freq: Every day | ORAL | Status: DC
  Administered 2018-06-05: 10 mg via ORAL
  Filled 2018-06-04: qty 1

## 2018-06-04 MED ORDER — CEFAZOLIN SODIUM-DEXTROSE 2-4 GM/100ML-% IV SOLN
2.0000 g | Freq: Four times a day (QID) | INTRAVENOUS | Status: AC
  Administered 2018-06-04 (×2): 2 g via INTRAVENOUS
  Filled 2018-06-04 (×2): qty 100

## 2018-06-04 MED ORDER — DEXAMETHASONE SODIUM PHOSPHATE 10 MG/ML IJ SOLN
10.0000 mg | Freq: Once | INTRAMUSCULAR | Status: AC
  Administered 2018-06-05: 10 mg via INTRAVENOUS
  Filled 2018-06-04: qty 1

## 2018-06-04 MED ORDER — CEFAZOLIN SODIUM-DEXTROSE 2-4 GM/100ML-% IV SOLN
INTRAVENOUS | Status: AC
  Filled 2018-06-04: qty 100

## 2018-06-04 MED ORDER — METHOCARBAMOL 500 MG PO TABS
500.0000 mg | ORAL_TABLET | Freq: Four times a day (QID) | ORAL | Status: DC | PRN
  Administered 2018-06-04 – 2018-06-05 (×2): 500 mg via ORAL
  Filled 2018-06-04 (×2): qty 1

## 2018-06-04 MED ORDER — SODIUM CHLORIDE 0.9 % IJ SOLN
INTRAMUSCULAR | Status: AC
  Filled 2018-06-04: qty 50

## 2018-06-04 MED ORDER — DULOXETINE HCL 30 MG PO CPEP
30.0000 mg | ORAL_CAPSULE | Freq: Every day | ORAL | Status: DC
  Administered 2018-06-05: 30 mg via ORAL
  Filled 2018-06-04: qty 1

## 2018-06-04 MED ORDER — 0.9 % SODIUM CHLORIDE (POUR BTL) OPTIME
TOPICAL | Status: DC | PRN
  Administered 2018-06-04: 1000 mL

## 2018-06-04 MED ORDER — ONDANSETRON HCL 4 MG/2ML IJ SOLN
INTRAMUSCULAR | Status: AC
  Filled 2018-06-04: qty 8

## 2018-06-04 MED ORDER — ONDANSETRON HCL 4 MG/2ML IJ SOLN
INTRAMUSCULAR | Status: DC | PRN
  Administered 2018-06-04: 4 mg via INTRAVENOUS

## 2018-06-04 MED ORDER — HYDROCODONE-ACETAMINOPHEN 7.5-325 MG PO TABS: 1.0000 | ORAL_TABLET | ORAL | 0 refills | Status: DC | PRN

## 2018-06-04 MED ORDER — METHOCARBAMOL 500 MG IVPB - SIMPLE MED
500.0000 mg | Freq: Four times a day (QID) | INTRAVENOUS | Status: DC | PRN
  Administered 2018-06-04: 500 mg via INTRAVENOUS
  Filled 2018-06-04: qty 50
  Filled 2018-06-04: qty 500

## 2018-06-04 MED ORDER — ACETAMINOPHEN 10 MG/ML IV SOLN: 1000.0000 mg | Freq: Once | INTRAVENOUS | Status: DC | PRN

## 2018-06-04 MED ORDER — ROPIVACAINE HCL 5 MG/ML IJ SOLN
INTRAMUSCULAR | Status: DC | PRN
  Administered 2018-06-04: 30 mL via PERINEURAL

## 2018-06-04 MED ORDER — BISACODYL 10 MG RE SUPP: 10.0000 mg | Freq: Every day | RECTAL | Status: DC | PRN

## 2018-06-04 MED ORDER — IRBESARTAN 150 MG PO TABS
150.0000 mg | ORAL_TABLET | Freq: Every day | ORAL | Status: DC
  Administered 2018-06-05: 150 mg via ORAL
  Filled 2018-06-04 (×2): qty 1

## 2018-06-04 MED ORDER — MORPHINE SULFATE (PF) 4 MG/ML IV SOLN: 0.5000 mg | INTRAVENOUS | Status: DC | PRN

## 2018-06-04 MED ORDER — DIPHENHYDRAMINE HCL 12.5 MG/5ML PO ELIX: 12.5000 mg | ORAL_SOLUTION | ORAL | Status: DC | PRN

## 2018-06-04 MED ORDER — MIDAZOLAM HCL 2 MG/2ML IJ SOLN
INTRAMUSCULAR | Status: AC
  Filled 2018-06-04: qty 2

## 2018-06-04 MED ORDER — CELECOXIB 200 MG PO CAPS
200.0000 mg | ORAL_CAPSULE | Freq: Two times a day (BID) | ORAL | Status: DC
  Administered 2018-06-04 – 2018-06-05 (×3): 200 mg via ORAL
  Filled 2018-06-04 (×3): qty 1

## 2018-06-04 MED ORDER — DEXAMETHASONE SODIUM PHOSPHATE 10 MG/ML IJ SOLN
INTRAMUSCULAR | Status: AC
  Filled 2018-06-04: qty 4

## 2018-06-04 MED ORDER — HYDROCODONE-ACETAMINOPHEN 5-325 MG PO TABS
1.0000 | ORAL_TABLET | ORAL | Status: DC | PRN
  Administered 2018-06-04 (×2): 1 via ORAL
  Filled 2018-06-04 (×2): qty 1

## 2018-06-04 MED ORDER — ASPIRIN 81 MG PO CHEW: 81.0000 mg | CHEWABLE_TABLET | Freq: Two times a day (BID) | ORAL | 0 refills | Status: DC

## 2018-06-04 MED ORDER — DOCUSATE SODIUM 100 MG PO CAPS: 100.0000 mg | ORAL_CAPSULE | Freq: Two times a day (BID) | ORAL | 0 refills | Status: AC

## 2018-06-04 MED ORDER — ONDANSETRON HCL 4 MG/2ML IJ SOLN: 4.0000 mg | Freq: Once | INTRAMUSCULAR | Status: DC | PRN

## 2018-06-04 MED ORDER — PHENOL 1.4 % MT LIQD
1.0000 | OROMUCOSAL | Status: DC | PRN
  Filled 2018-06-04: qty 177

## 2018-06-04 MED ORDER — PROPOFOL 500 MG/50ML IV EMUL
INTRAVENOUS | Status: DC | PRN
  Administered 2018-06-04: 75 ug/kg/min via INTRAVENOUS

## 2018-06-04 MED ORDER — TRANEXAMIC ACID 1000 MG/10ML IV SOLN
INTRAVENOUS | Status: AC
  Filled 2018-06-04: qty 10

## 2018-06-04 MED ORDER — STERILE WATER FOR IRRIGATION IR SOLN
Status: DC | PRN
  Administered 2018-06-04: 2000 mL

## 2018-06-04 MED ORDER — HYDROCHLOROTHIAZIDE 25 MG PO TABS
25.0000 mg | ORAL_TABLET | Freq: Every morning | ORAL | Status: DC
  Administered 2018-06-04 – 2018-06-05 (×2): 25 mg via ORAL
  Filled 2018-06-04 (×3): qty 1

## 2018-06-04 MED ORDER — TRAZODONE HCL 50 MG PO TABS
50.0000 mg | ORAL_TABLET | Freq: Every day | ORAL | Status: DC
  Administered 2018-06-04: 50 mg via ORAL
  Filled 2018-06-04: qty 1

## 2018-06-04 MED ORDER — ONDANSETRON HCL 4 MG PO TABS: 4.0000 mg | ORAL_TABLET | Freq: Four times a day (QID) | ORAL | Status: DC | PRN

## 2018-06-04 MED ORDER — LORATADINE 10 MG PO TABS
10.0000 mg | ORAL_TABLET | Freq: Every day | ORAL | Status: DC
  Administered 2018-06-04 – 2018-06-05 (×2): 10 mg via ORAL
  Filled 2018-06-04 (×2): qty 1

## 2018-06-04 MED ORDER — DOCUSATE SODIUM 100 MG PO CAPS
100.0000 mg | ORAL_CAPSULE | Freq: Two times a day (BID) | ORAL | Status: DC
  Administered 2018-06-04 – 2018-06-05 (×3): 100 mg via ORAL
  Filled 2018-06-04 (×3): qty 1

## 2018-06-04 MED ORDER — TRANEXAMIC ACID 1000 MG/10ML IV SOLN
1000.0000 mg | INTRAVENOUS | Status: AC
  Administered 2018-06-04: 1000 mg via INTRAVENOUS

## 2018-06-04 MED ORDER — PANTOPRAZOLE SODIUM 40 MG PO TBEC
40.0000 mg | DELAYED_RELEASE_TABLET | Freq: Two times a day (BID) | ORAL | Status: DC
  Administered 2018-06-04 – 2018-06-05 (×2): 40 mg via ORAL
  Filled 2018-06-04 (×2): qty 1

## 2018-06-04 MED ORDER — ONDANSETRON HCL 4 MG/2ML IJ SOLN: 4.0000 mg | Freq: Four times a day (QID) | INTRAMUSCULAR | Status: DC | PRN

## 2018-06-04 MED ORDER — PHENYLEPHRINE HCL 10 MG/ML IJ SOLN
INTRAMUSCULAR | Status: AC
  Filled 2018-06-04: qty 1

## 2018-06-04 MED ORDER — SODIUM CHLORIDE 0.9 % IJ SOLN
INTRAMUSCULAR | Status: DC | PRN
  Administered 2018-06-04: 29 mL

## 2018-06-04 MED ORDER — ACETAMINOPHEN 325 MG PO TABS: 325.0000 mg | ORAL_TABLET | Freq: Four times a day (QID) | ORAL | Status: DC | PRN

## 2018-06-04 MED ORDER — HYDROMORPHONE HCL 1 MG/ML IJ SOLN
0.2500 mg | INTRAMUSCULAR | Status: DC | PRN
Start: 2018-06-04 — End: 2018-06-04

## 2018-06-04 MED ORDER — BUPIVACAINE-EPINEPHRINE (PF) 0.25% -1:200000 IJ SOLN
INTRAMUSCULAR | Status: DC | PRN
  Administered 2018-06-04: 30 mL

## 2018-06-04 MED ORDER — RIVAROXABAN 10 MG PO TABS: 10.0000 mg | ORAL_TABLET | Freq: Every day | ORAL | 0 refills | Status: DC

## 2018-06-04 MED ORDER — BUPIVACAINE IN DEXTROSE 0.75-8.25 % IT SOLN
INTRATHECAL | Status: DC | PRN
  Administered 2018-06-04: 2 mL via INTRATHECAL

## 2018-06-04 MED ORDER — POLYETHYLENE GLYCOL 3350 17 G PO PACK
17.0000 g | PACK | Freq: Two times a day (BID) | ORAL | Status: DC
  Administered 2018-06-04 (×2): 17 g via ORAL
  Filled 2018-06-04 (×2): qty 1

## 2018-06-04 MED ORDER — FERROUS SULFATE 325 (65 FE) MG PO TABS
325.0000 mg | ORAL_TABLET | Freq: Two times a day (BID) | ORAL | Status: DC
  Administered 2018-06-04 – 2018-06-05 (×2): 325 mg via ORAL
  Filled 2018-06-04 (×2): qty 1

## 2018-06-04 MED ORDER — BUPIVACAINE-EPINEPHRINE (PF) 0.25% -1:200000 IJ SOLN
INTRAMUSCULAR | Status: AC
  Filled 2018-06-04: qty 30

## 2018-06-04 MED ORDER — SODIUM CHLORIDE 0.9 % IV SOLN
INTRAVENOUS | Status: DC
  Administered 2018-06-04 – 2018-06-05 (×2): via INTRAVENOUS

## 2018-06-04 MED ORDER — MENTHOL 3 MG MT LOZG: 1.0000 | LOZENGE | OROMUCOSAL | Status: DC | PRN

## 2018-06-04 MED ORDER — PROPOFOL 10 MG/ML IV BOLUS
INTRAVENOUS | Status: AC
  Filled 2018-06-04: qty 60

## 2018-06-04 MED ORDER — FENTANYL CITRATE (PF) 100 MCG/2ML IJ SOLN
INTRAMUSCULAR | Status: DC | PRN
  Administered 2018-06-04 (×2): 50 ug via INTRAVENOUS

## 2018-06-04 MED ORDER — PROPOFOL 10 MG/ML IV BOLUS
INTRAVENOUS | Status: AC
  Filled 2018-06-04: qty 20

## 2018-06-04 MED ORDER — POLYETHYLENE GLYCOL 3350 17 G PO PACK: 17.0000 g | PACK | Freq: Two times a day (BID) | ORAL | 0 refills | Status: AC

## 2018-06-04 MED ORDER — CEFAZOLIN SODIUM-DEXTROSE 2-4 GM/100ML-% IV SOLN
2.0000 g | INTRAVENOUS | Status: AC
  Administered 2018-06-04: 2 g via INTRAVENOUS

## 2018-06-04 MED ORDER — KETOROLAC TROMETHAMINE 30 MG/ML IJ SOLN
INTRAMUSCULAR | Status: DC | PRN
  Administered 2018-06-04: 30 mg

## 2018-06-04 MED ORDER — FLUTICASONE PROPIONATE 50 MCG/ACT NA SUSP
2.0000 | Freq: Two times a day (BID) | NASAL | Status: DC | PRN
  Filled 2018-06-04: qty 16

## 2018-06-04 MED ORDER — HYDROCODONE-ACETAMINOPHEN 7.5-325 MG PO TABS
1.0000 | ORAL_TABLET | ORAL | Status: DC | PRN
  Administered 2018-06-05 (×2): 1 via ORAL
  Filled 2018-06-04 (×2): qty 1

## 2018-06-04 MED ORDER — MEPERIDINE HCL 50 MG/ML IJ SOLN: 6.2500 mg | INTRAMUSCULAR | Status: DC | PRN

## 2018-06-04 MED ORDER — SODIUM CHLORIDE 0.9 % IV SOLN
INTRAVENOUS | Status: DC | PRN
  Administered 2018-06-04: 50 ug/min via INTRAVENOUS

## 2018-06-04 MED ORDER — METOCLOPRAMIDE HCL 5 MG PO TABS: 5.0000 mg | ORAL_TABLET | Freq: Three times a day (TID) | ORAL | Status: DC | PRN

## 2018-06-04 MED ORDER — HYDROCODONE-ACETAMINOPHEN 7.5-325 MG PO TABS: 1.0000 | ORAL_TABLET | Freq: Once | ORAL | Status: DC | PRN

## 2018-06-04 MED ORDER — PHENYLEPHRINE 40 MCG/ML (10ML) SYRINGE FOR IV PUSH (FOR BLOOD PRESSURE SUPPORT)
PREFILLED_SYRINGE | INTRAVENOUS | Status: DC | PRN
  Administered 2018-06-04 (×5): 80 ug via INTRAVENOUS

## 2018-06-04 MED ORDER — SODIUM CHLORIDE 0.9 % IR SOLN
Status: DC | PRN
  Administered 2018-06-04: 1000 mL

## 2018-06-04 SURGICAL SUPPLY — 52 items
ATTUNE MED ANAT PAT 29 KNEE (Knees) ×2 IMPLANT
ATTUNE PS FEM RT SZ 4 CEM KNEE (Femur) ×2 IMPLANT
ATTUNE PSRP INSR SZ4 6 KNEE (Insert) ×2 IMPLANT
BAG ZIPLOCK 12X15 (MISCELLANEOUS) ×2 IMPLANT
BANDAGE ACE 6X5 VEL STRL LF (GAUZE/BANDAGES/DRESSINGS) ×2 IMPLANT
BASEPLATE TIBIAL ROTATING SZ 4 (Knees) ×2 IMPLANT
BLADE SAW SGTL 11.0X1.19X90.0M (BLADE) ×2 IMPLANT
BLADE SAW SGTL 13.0X1.19X90.0M (BLADE) ×2 IMPLANT
BOWL SMART MIX CTS (DISPOSABLE) ×2 IMPLANT
CEMENT HV SMART SET (Cement) ×4 IMPLANT
COVER SURGICAL LIGHT HANDLE (MISCELLANEOUS) ×2 IMPLANT
CUFF TOURN SGL QUICK 34 (TOURNIQUET CUFF) ×1
CUFF TRNQT CYL 34X4X40X1 (TOURNIQUET CUFF) ×1 IMPLANT
DECANTER SPIKE VIAL GLASS SM (MISCELLANEOUS) ×4 IMPLANT
DERMABOND ADVANCED (GAUZE/BANDAGES/DRESSINGS) ×1
DERMABOND ADVANCED .7 DNX12 (GAUZE/BANDAGES/DRESSINGS) ×1 IMPLANT
DRAPE U-SHAPE 47X51 STRL (DRAPES) ×2 IMPLANT
DRESSING AQUACEL AG SP 3.5X10 (GAUZE/BANDAGES/DRESSINGS) ×1 IMPLANT
DRSG AQUACEL AG SP 3.5X10 (GAUZE/BANDAGES/DRESSINGS) ×2
DURAPREP 26ML APPLICATOR (WOUND CARE) ×4 IMPLANT
ELECT REM PT RETURN 15FT ADLT (MISCELLANEOUS) ×2 IMPLANT
GLOVE BIOGEL PI IND STRL 7.0 (GLOVE) ×2 IMPLANT
GLOVE BIOGEL PI IND STRL 7.5 (GLOVE) ×7 IMPLANT
GLOVE BIOGEL PI IND STRL 8.5 (GLOVE) ×1 IMPLANT
GLOVE BIOGEL PI INDICATOR 7.0 (GLOVE) ×2
GLOVE BIOGEL PI INDICATOR 7.5 (GLOVE) ×7
GLOVE BIOGEL PI INDICATOR 8.5 (GLOVE) ×1
GLOVE ECLIPSE 8.0 STRL XLNG CF (GLOVE) ×6 IMPLANT
GLOVE ORTHO TXT STRL SZ7.5 (GLOVE) ×2 IMPLANT
GOWN SPEC L3 XXLG W/TWL (GOWN DISPOSABLE) ×4 IMPLANT
GOWN STRL REUS W/TWL LRG LVL3 (GOWN DISPOSABLE) ×2 IMPLANT
GOWN STRL REUS W/TWL XL LVL3 (GOWN DISPOSABLE) ×4 IMPLANT
HANDPIECE INTERPULSE COAX TIP (DISPOSABLE) ×1
HOLDER FOLEY CATH W/STRAP (MISCELLANEOUS) ×2 IMPLANT
MANIFOLD NEPTUNE II (INSTRUMENTS) ×2 IMPLANT
NDL SAFETY ECLIPSE 18X1.5 (NEEDLE) IMPLANT
NEEDLE HYPO 18GX1.5 SHARP (NEEDLE)
PACK TOTAL KNEE CUSTOM (KITS) ×2 IMPLANT
PIN FIX SIGMA HP QUICK REL (PIN) ×2 IMPLANT
PIN THREADED HEADED SIGMA (PIN) ×2 IMPLANT
POSITIONER SURGICAL ARM (MISCELLANEOUS) ×2 IMPLANT
SET HNDPC FAN SPRY TIP SCT (DISPOSABLE) ×1 IMPLANT
SET PAD KNEE POSITIONER (MISCELLANEOUS) ×2 IMPLANT
SUT MNCRL AB 4-0 PS2 18 (SUTURE) ×2 IMPLANT
SUT STRATAFIX PDS+ 0 24IN (SUTURE) ×2 IMPLANT
SUT VIC AB 1 CT1 36 (SUTURE) ×2 IMPLANT
SUT VIC AB 2-0 CT1 27 (SUTURE) ×3
SUT VIC AB 2-0 CT1 TAPERPNT 27 (SUTURE) ×3 IMPLANT
SYRINGE 3CC LL L/F (MISCELLANEOUS) ×2 IMPLANT
TRAY FOLEY CATH 14FR (SET/KITS/TRAYS/PACK) ×2 IMPLANT
WRAP KNEE MAXI GEL POST OP (GAUZE/BANDAGES/DRESSINGS) ×2 IMPLANT
YANKAUER SUCT BULB TIP 10FT TU (MISCELLANEOUS) ×2 IMPLANT

## 2018-06-04 NOTE — Op Note (Signed)
NAME:  Nicole Stephenson                      MEDICAL RECORD NO.:  [id number]                             FACILITY:  [facility]      PHYSICIAN:  Nicole Frankel. Charlann Boxer, M.D.  DATE OF BIRTH:  1946/05/30      DATE OF PROCEDURE:  06/04/2018                                     OPERATIVE REPORT         PREOPERATIVE DIAGNOSIS:  Right knee osteoarthritis.      POSTOPERATIVE DIAGNOSIS:  Right knee osteoarthritis.      FINDINGS:  The patient was noted to have complete loss of cartilage and   bone-on-bone arthritis with associated osteophytes in the patellofemoral (see below)and lateral compartments of   the knee with a significant synovitis and associated effusion.  The patient had failed months of conservative treatment including medications, injection therapy, activity modification.     PROCEDURE:  Right total knee replacement.      COMPONENTS USED:  DePuy Attune rotating platform posterior stabilized knee   system, a size 4 femur, 4 tibia, size 6 mm PS AOX insert, and 29 anatomic patellar   button.      SURGEON:  Nicole Frankel. Charlann Boxer, M.D.      ASSISTANT:  Nicole Gins, PA-C.      ANESTHESIA:  Regional and Spinal.      SPECIMENS:  None.      COMPLICATION:  None.      DRAINS:  None.  EBL: <100      TOURNIQUET TIME:   Total Tourniquet Time Documented: Thigh (Right) - 36 minutes Total: Thigh (Right) - 36 minutes  .      The patient was stable to the recovery room.      INDICATION FOR PROCEDURE:  Nicole Stephenson is a 72 y.o. female patient of   mine.  The patient had been seen, evaluated, and treated for months conservatively in the   office with medication, activity modification, and injections.  The patient had   radiographic changes of bone-on-bone arthritis with endplate sclerosis and osteophytes noted.  Based on the radiographic changes and failed conservative measures, the patient   decided to proceed with definitive treatment, total knee replacement.  Risks of infection, DVT, component  failure, need for revision surgery, neurovascular injury were reviewed in the office setting.  The postop course was reviewed stressing the efforts to maximize post-operative satisfaction and function.  Consent was obtained for benefit of pain   relief.      PROCEDURE IN DETAIL:  The patient was brought to the operative theater.   Once adequate anesthesia, preoperative antibiotics, 2 gm of Ancef,1 gm of Tranexamic Acid, and 10 mg of Decadron administered, the patient was positioned supine with a right thigh tourniquet placed.  The  right lower extremity was prepped and draped in sterile fashion.  A time-   out was performed identifying the patient, planned procedure, and the appropriate extremity.      The right lower extremity was placed in the [facility] leg holder.  The leg was   exsanguinated, tourniquet elevated to 250 mmHg.  A midline incision was  made followed by median parapatellar arthrotomy.  Following initial   exposure, attention was first directed to the patella.  Her patellofemoral compartment was severely effected.  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  The patella was noted to be very thin as well.   Precut   measurement was noted to be 13 mm.  I first excised the non-united lateral facet.   I then resected only about a 1-2 mm to freshen the bone surface and used a   29 anatomic patellar button to restore patellar height as well as cover the cut surface.      The lug holes were carefully pre-drilled and then drilled and a metal shim was placed to protect the   patella from retractors and saw blade during the procedure.      At this point, attention was now directed to the femur.  The femoral   canal was opened with a drill, irrigated to try to prevent fat emboli.  An   intramedullary rod was passed at 3 degrees valgus, 9 mm of bone was   resected off the distal femur.  Following this resection, the tibia was    subluxated anteriorly.  Using the extramedullary guide, 2 mm of bone was resected off   the proximal lateral tibia.  We confirmed the gap would be   stable medially and laterally with a size 5 spacer block as well as confirmed that the tibial cut was perpendicular in the coronal plane, checking with an alignment rod.      Once this was done, I sized the femur to be a size 4 in the anterior-   posterior dimension, chose a standard component based on medial and   lateral dimension.  The size 4 rotation block was then pinned in   position anterior referenced using the C-clamp to set rotation.  The   anterior, posterior, and  chamfer cuts were made without difficulty nor   notching making certain that I was along the anterior cortex to help   with flexion gap stability.  The lateral femoral condyle and anterior cortex were significantly eroded secondary to her degenerative disease.  The anterior cut did not remove bone over the anterior lateral one third of the distal femur.     The final box cut was made off the lateral aspect of distal femur.      At this point, the tibia was sized to be a size 4.  The size 4 tray was   then pinned in position through the medial third of the tubercle,   drilled, and keel punched.  Trial reduction was now carried with a 4 femur,  4 tibia, a size 6 mm PS insert, and the 29 anatomic patella botton.  The knee was brought to full extension with good flexion stability with the patella   tracking through the trochlea without application of pressure.  Given   all these findings the trial components removed.  Final components were   opened and cement was mixed.  The knee was irrigated with normal saline solution and pulse lavage.  The synovial lining was   then injected with 30 cc of 0.25% Marcaine with epinephrine, 1 cc of Toradol and 30 cc of NS for a total of 61 cc.     Final implants were then cemented onto cleaned and dried cut surfaces of bone with the knee brought  to extension with a size 6 mm  PS trial insert.      Once the cement had fully cured, excess cement was removed   throughout the knee.  I confirmed that I was satisfied with the range of   motion and stability, and the final size 6 mm PS AOX insert was chosen.  It was   placed into the knee.      The tourniquet had been let down at 36 minutes.  No significant   hemostasis was required.  The extensor mechanism was then reapproximated using #1 Vicryl and #1 Stratafix sutures with the knee   in flexion.  The   remaining wound was closed with 2-0 Vicryl and running 4-0 Monocryl.   The knee was cleaned, dried, dressed sterilely using Dermabond and   Aquacel dressing.  The patient was then   brought to recovery room in stable condition, tolerating the procedure   well.   Please note that Physician Assistant, Nicole GinsMatthew Babish, PA-C was present for the entirety of the case, and was utilized for pre-operative positioning, peri-operative retractor management, general facilitation of the procedure and for primary wound closure at the end of the case.              Nicole FrankelMatthew D. Charlann Stephenson, M.D.    06/04/2018 8:42 AM

## 2018-06-04 NOTE — Anesthesia Procedure Notes (Signed)
Spinal  Patient location during procedure: OR Start time: 06/04/2018 7:15 AM End time: 06/04/2018 7:22 AM Staffing Anesthesiologist: Trevor IhaHouser, Stephen A, MD Performed: anesthesiologist  Preanesthetic Checklist Completed: patient identified, surgical consent, pre-op evaluation, timeout performed, IV checked, risks and benefits discussed and monitors and equipment checked Spinal Block Patient position: sitting Prep: site prepped and draped and DuraPrep Patient monitoring: heart rate, cardiac monitor, continuous pulse ox and blood pressure Approach: midline Location: L2-3 Injection technique: single-shot Needle Needle type: Pencan  Needle gauge: 24 G Needle length: 10 cm Needle insertion depth: 7 cm Assessment Sensory level: T4 Additional Notes 2 attempts. Pt tolerated procedure well.

## 2018-06-04 NOTE — Anesthesia Procedure Notes (Signed)
Anesthesia Regional Block: Adductor canal block   Pre-Anesthetic Checklist: ,, timeout performed, Correct Patient, Correct Site, Correct Laterality, Correct Procedure, Correct Position, site marked, Risks and benefits discussed,  Surgical consent,  Pre-op evaluation,  At surgeon's request and post-op pain management  Laterality: Right  Prep: Maximum Sterile Barrier Precautions used, chloraprep       Needles:  Injection technique: Single-shot  Needle Type: Echogenic Needle     Needle Length: 9cm  Needle Gauge: 21     Additional Needles:   Procedures:,,,, ultrasound used (permanent image in chart),,,,  Narrative:  Start time: 06/04/2018 6:55 AM End time: 06/04/2018 7:05 AM Injection made incrementally with aspirations every 5 mL.  Performed by: Personally  Anesthesiologist: Trevor IhaHouser, Kristl Morioka A, MD

## 2018-06-04 NOTE — Transfer of Care (Signed)
Immediate Anesthesia Transfer of Care Note  Patient: Nicole Stephenson  Procedure(s) Performed: RIGHT TOTAL KNEE ARTHROPLASTY (Right Knee)  Patient Location: PACU  Anesthesia Type:Spinal  Level of Consciousness: awake, alert  and oriented  Airway & Oxygen Therapy: Patient Spontanous Breathing and Patient connected to face mask oxygen  Post-op Assessment: Report given to RN  Post vital signs: Reviewed and stable  Last Vitals:  Vitals Value Taken Time  BP 118/68 06/04/2018  9:11 AM  Temp    Pulse 70 06/04/2018  9:14 AM  Resp 9 06/04/2018  9:14 AM  SpO2 100 % 06/04/2018  9:14 AM  Vitals shown include unvalidated device data.  Last Pain:  Vitals:   06/04/18 0549  TempSrc: Oral         Complications: No apparent anesthesia complications

## 2018-06-04 NOTE — Anesthesia Postprocedure Evaluation (Signed)
Anesthesia Post Note  Patient: Nicole Stephenson  Procedure(s) Performed: RIGHT TOTAL KNEE ARTHROPLASTY (Right Knee)     Patient location during evaluation: PACU Anesthesia Type: Regional and Spinal Level of consciousness: oriented and awake and alert Pain management: pain level controlled Vital Signs Assessment: post-procedure vital signs reviewed and stable Respiratory status: spontaneous breathing, respiratory function stable and patient connected to nasal cannula oxygen Cardiovascular status: blood pressure returned to baseline and stable Postop Assessment: no headache, no backache and no apparent nausea or vomiting Anesthetic complications: no    Last Vitals:  Vitals:   06/04/18 1200 06/04/18 1300  BP: (!) 142/68 (!) 115/59  Pulse: 76 74  Resp: 14 16  Temp: 36.4 C 36.6 C  SpO2: 100% 100%    Last Pain:  Vitals:   06/04/18 1300  TempSrc: Oral  PainSc:                  Nicole Stephenson

## 2018-06-04 NOTE — Interval H&P Note (Signed)
History and Physical Interval Note:  06/04/2018 7:09 AM  Nicole Stephenson  has presented today for surgery, with the diagnosis of Right knee osteoarthritis  The various methods of treatment have been discussed with the patient and family. After consideration of risks, benefits and other options for treatment, the patient has consented to  Procedure(s) with comments: RIGHT TOTAL KNEE ARTHROPLASTY (Right) - 70 mins as a surgical intervention .  The patient's history has been reviewed, patient examined, no change in status, stable for surgery.  I have reviewed the patient's chart and labs.  Questions were answered to the patient's satisfaction.     Shelda PalMatthew D Nasiyah Laverdiere

## 2018-06-04 NOTE — Plan of Care (Signed)
Pt alert and oriented.  Doing well post-op.  Pain well controlled with PO pain meds.  RN will monitor.

## 2018-06-04 NOTE — Discharge Instructions (Addendum)

## 2018-06-04 NOTE — Evaluation (Signed)
Physical Therapy Evaluation Patient Details Name: Nicole Stephenson MRN: [id number] DOB: 11/04/1945 Today's Date: 06/04/2018   History of Present Illness  72 yo female s/p R TKA 06/04/18. 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   Clinical Impression  On eval POD 0, pt required Min assist for mobility. She walked ~25 feet with a RW. Pain rated 7/10 with activity. Will follow and progress activity as tolerated. Per chart, plan is for d/c home with OP PT f/u.     Follow Up Recommendations Follow surgeon's recommendation for DC plan and follow-up therapies    Equipment Recommendations  None recommended by PT    Recommendations for Other Services       Precautions / Restrictions Precautions Precautions: Fall;Shoulder Type of Shoulder Precautions: limited ROM. Do not pull on L shoulder Restrictions Weight Bearing Restrictions: No Other Position/Activity Restrictions: WBAT      Mobility  Bed Mobility Overal bed mobility: Needs Assistance Bed Mobility: Supine to Sit     Supine to sit: Min assist;HOB elevated     General bed mobility comments: Assist for R LE. Increased time.   Transfers Overall transfer level: Needs assistance Equipment used: Rolling walker (2 wheeled) Transfers: Sit to/from Stand Sit to Stand: Min assist;From elevated surface         General transfer comment: VCs safety, technique, hand/LE placement. Increased time.   Ambulation/Gait Ambulation/Gait assistance: Min assist Gait Distance (Feet): 25 Feet Assistive device: Rolling walker (2 wheeled) Gait Pattern/deviations: Step-to pattern;Antalgic     General Gait Details: VCs safety, technique, sequence. Assist to stabilize throughout distance. Slow gait speed due to pain. Followed with recliner.   Stairs            Wheelchair Mobility    Modified Rankin (Stroke Patients Only)       Balance                                              Pertinent Vitals/Pain Pain Assessment: 0-10 Pain Score: 7  Pain Location: R knee Pain Descriptors / Indicators: Sore;Aching Pain Intervention(s): Monitored during session;Repositioned;Ice applied    Home Living Family/patient expects to be discharged to:: Private residence Living Arrangements: Other relatives Available Help at Discharge: Family;Available 24 hours/day Type of Home: House Home Access: Stairs to enter Entrance Stairs-Rails: Right Entrance Stairs-Number of Steps: 5 Home Layout: One level Home Equipment: Walker - 2 wheels;Cane - single point;Wheelchair - manual;Bedside commode      Prior Function Level of Independence: Independent               Hand Dominance        Extremity/Trunk Assessment   Upper Extremity Assessment Upper Extremity Assessment: LUE deficits/detail LUE Deficits / Details: s/p reverse shoulder + revision 2018    Lower Extremity Assessment Lower Extremity Assessment: Generalized weakness(s/p R TKA)    Cervical / Trunk Assessment Cervical / Trunk Assessment: Normal  Communication   Communication: No difficulties  Cognition Arousal/Alertness: Awake/alert Behavior During Therapy: WFL for tasks assessed/performed Overall Cognitive Status: Within Functional Limits for tasks assessed                                        General Comments      Exercises  Assessment/Plan    PT Assessment Patient needs continued PT services  PT Problem List Decreased strength;Decreased balance;Decreased mobility;Decreased activity tolerance;Decreased range of motion;Pain;Decreased knowledge of use of DME       PT Treatment Interventions DME instruction;Gait training;Stair training;Therapeutic activities;Functional mobility training;Balance training;Patient/family education;Therapeutic exercise    PT Goals (Current goals can be found in the Care Plan section)  Acute Rehab PT Goals Patient Stated Goal: less pain.  regain PLOF.  PT Goal Formulation: With patient Time For Goal Achievement: 06/18/18 Potential to Achieve Goals: Good    Frequency 7X/week   Barriers to discharge        Co-evaluation               AM-PAC PT "6 Clicks" Daily Activity  Outcome Measure Difficulty turning over in bed (including adjusting bedclothes, sheets and blankets)?: A Lot Difficulty moving from lying on back to sitting on the side of the bed? : Unable Difficulty sitting down on and standing up from a chair with arms (e.g., wheelchair, bedside commode, etc,.)?: Unable Help needed moving to and from a bed to chair (including a wheelchair)?: A Little Help needed walking in hospital room?: A Little Help needed climbing 3-5 steps with a railing? : A Lot 6 Click Score: 12    End of Session Equipment Utilized During Treatment: Gait belt Activity Tolerance: Patient limited by pain Patient left: in chair;with call bell/phone within reach;with family/visitor present   PT Visit Diagnosis: Pain;Difficulty in walking, not elsewhere classified (R26.2);Other abnormalities of gait and mobility (R26.89) Pain - Right/Left: Left Pain - part of body: Knee    Time: [id number] PT Time Calculation (min) (ACUTE ONLY): 14 min   Charges:   PT Evaluation $PT Eval Low Complexity: 1 Low           Rebeca AlertJannie Vernor Monnig, MPT Pager: [phone]

## 2018-06-05 ENCOUNTER — Encounter (HOSPITAL_COMMUNITY): Payer: Self-pay | Admitting: Orthopedic Surgery

## 2018-06-05 DIAGNOSIS — E663 Overweight: Secondary | ICD-10-CM | POA: Diagnosis present

## 2018-06-05 LAB — BASIC METABOLIC PANEL
ANION GAP: 8 (ref 5–15)
BUN: 20 mg/dL (ref 8–23)
CALCIUM: 9.1 mg/dL (ref 8.9–10.3)
CO2: 26 mmol/L (ref 22–32)
Chloride: 100 mmol/L (ref 98–111)
Creatinine, Ser: 1.24 mg/dL — ABNORMAL HIGH (ref 0.44–1.00)
GFR, EST AFRICAN AMERICAN: 49 mL/min — AB (ref >60)
GFR, EST NON AFRICAN AMERICAN: 42 mL/min — AB (ref >60)
Glucose, Bld: 126 mg/dL — ABNORMAL HIGH (ref 70–99)
Potassium: 4.5 mmol/L (ref 3.5–5.1)
Sodium: 134 mmol/L — ABNORMAL LOW (ref 135–145)

## 2018-06-05 LAB — CBC
HEMATOCRIT: 28.6 % — AB (ref 36.0–46.0)
Hemoglobin: 9.5 g/dL — ABNORMAL LOW (ref 12.0–15.0)
MCH: 30.5 pg (ref 26.0–34.0)
MCHC: 33.2 g/dL (ref 30.0–36.0)
MCV: 92 fL (ref 78.0–100.0)
PLATELETS: 215 10*3/uL (ref 150–400)
RBC: 3.11 MIL/uL — ABNORMAL LOW (ref 3.87–5.11)
RDW: 15.1 % (ref 11.5–15.5)
WBC: 7.1 10*3/uL (ref 4.0–10.5)

## 2018-06-05 MED ORDER — ONDANSETRON HCL 4 MG PO TABS: 4.0000 mg | ORAL_TABLET | Freq: Four times a day (QID) | ORAL | 0 refills | Status: AC | PRN

## 2018-06-05 MED ORDER — ASPIRIN 81 MG PO CHEW: 81.0000 mg | CHEWABLE_TABLET | Freq: Two times a day (BID) | ORAL | 0 refills | Status: DC

## 2018-06-05 MED ORDER — RIVAROXABAN 10 MG PO TABS: 10.0000 mg | ORAL_TABLET | Freq: Every day | ORAL | 0 refills | Status: DC

## 2018-06-05 MED ORDER — HYDROCODONE-ACETAMINOPHEN 7.5-325 MG PO TABS: 1.0000 | ORAL_TABLET | ORAL | 0 refills | Status: DC | PRN

## 2018-06-05 MED ORDER — METHOCARBAMOL 500 MG PO TABS: 500.0000 mg | ORAL_TABLET | Freq: Four times a day (QID) | ORAL | 0 refills | Status: DC | PRN

## 2018-06-05 NOTE — Progress Notes (Signed)
     Subjective: 1 Day Post-Op Procedure(s) (LRB): RIGHT TOTAL KNEE ARTHROPLASTY (Right)   Patient reports pain as mild, controlled with medication.  No events throughout the night. States that she feels good and looking forward to recovering.  Ready to be discharged home if she does well with PT.   Patient's anticipated LOS is less than 2 midnights, meeting these requirements: - Lives within 1 hour of care - Has a competent adult at home to recover with post-op recover - NO history of  - Chronic pain requiring opiods  - Diabetes  - Coronary Artery Disease  - Heart failure  - Heart attack  - Stroke  - DVT/VTE  - Cardiac arrhythmia  - Respiratory Failure/COPD  - Advanced Liver disease       Objective:   VITALS:   Vitals:   06/05/18 0616 06/05/18 0850  BP: 140/79 (!) 142/79  Pulse: 75   Resp: 16   Temp: 98.1 F (36.7 C)   SpO2: 100%     Dorsiflexion/Plantar flexion intact Incision: dressing C/D/I No cellulitis present Compartment soft  LABS Recent Labs    06/05/18 0526  HGB 9.5*  HCT 28.6*  WBC 7.1  PLT 215    Recent Labs    06/05/18 0526  NA 134*  K 4.5  BUN 20  CREATININE 1.24*  GLUCOSE 126*     Assessment/Plan: 1 Day Post-Op Procedure(s) (LRB): RIGHT TOTAL KNEE ARTHROPLASTY (Right) Foley cath d/c'ed Advance diet Up with therapy D/C IV fluids Discharge home [facility] Follow up with OLIN,Josephmichael Lisenbee D in 2 weeks.  Contact information:  [facility]) [address] [place] [id number] [phone]    Overweight (BMI 25-29.9) Estimated body mass index is 29.5 kg/m as calculated from the following:   Height as of this encounter: 5' 5.5" (1.664 m).   Weight as of this encounter: 81.6 kg. Patient also counseled that weight may inhibit the healing process Patient counseled that losing weight will help with future health  issues         Anastasio AuerbachMatthew S. Dezra Mandella   PAC  06/05/2018, 9:16 AM

## 2018-06-05 NOTE — Progress Notes (Signed)
Physical Therapy Treatment Patient Details Name: Nicole Stephenson MRN: [id number] DOB: 02/26/1946 Today's Date: 06/05/2018    History of Present Illness 72 yo female s/p R TKA 06/04/18. 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     PT Comments    Progressing with mobility. Reviewed/practiced exercises, gait training, and stair training. Issued HEP for pt to perform 2x/day until she begins f/u PT. All education completed. Okay to d/c from PT standpoint-made RN aware.     Follow Up Recommendations  Follow surgeon's recommendation for DC plan and follow-up therapies     Equipment Recommendations  None recommended by PT    Recommendations for Other Services       Precautions / Restrictions Precautions Precautions: Fall;Shoulder Type of Shoulder Precautions: limited ROM. Do not pull on L shoulder Restrictions Weight Bearing Restrictions: No Other Position/Activity Restrictions: WBAT    Mobility  Bed Mobility Overal bed mobility: Needs Assistance Bed Mobility: Supine to Sit     Supine to sit: Supervision;HOB elevated        Transfers Overall transfer level: Needs assistance Equipment used: Rolling walker (2 wheeled) Transfers: Sit to/from Stand Sit to Stand: Min guard         General transfer comment: VCs safety, technique, hand/LE placement. Increased time. Close guard for safety  Ambulation/Gait Ambulation/Gait assistance: Min guard Gait Distance (Feet): 60 Feet Assistive device: Rolling walker (2 wheeled) Gait Pattern/deviations: Step-to pattern;Trunk flexed;Antalgic     General Gait Details: VCs safety, technique, sequence. Slow gait speed due to pain. Close guard for safety. Brief standing rest breaks during walk   Stairs Stairs: Yes Stairs assistance: Min guard Stair Management: One rail Left;Step to pattern Number of Stairs: 2 General stair comments: up and over portable steps. VCs safety, sequence, technique. Pt used 2 hands  on L rail. Close guard for safety.    Wheelchair Mobility    Modified Rankin (Stroke Patients Only)       Balance                                            Cognition Arousal/Alertness: Awake/alert Behavior During Therapy: WFL for tasks assessed/performed Overall Cognitive Status: Within Functional Limits for tasks assessed                                        Exercises Total Joint Exercises Ankle Circles/Pumps: AROM;Both;10 reps;Supine Quad Sets: AROM;Both;10 reps;Supine Heel Slides: AAROM;Right;10 reps;Supine Straight Leg Raises: AAROM;AROM;Right;10 reps;Supine Long Arc Quad: AROM;Right;10 reps;Seated Knee Flexion: AROM;Both;10 reps;Seated Goniometric ROM: ~10-70 degrees    General Comments        Pertinent Vitals/Pain Pain Assessment: 0-10 Pain Score: 7  Pain Location: R knee Pain Descriptors / Indicators: Sore;Aching Pain Intervention(s): Monitored during session;Repositioned;Ice applied    Home Living                      Prior Function            PT Goals (current goals can now be found in the care plan section) Progress towards PT goals: Progressing toward goals    Frequency    7X/week      PT Plan Current plan remains appropriate    Co-evaluation  AM-PAC PT "6 Clicks" Daily Activity  Outcome Measure  Difficulty turning over in bed (including adjusting bedclothes, sheets and blankets)?: A Little Difficulty moving from lying on back to sitting on the side of the bed? : A Little Difficulty sitting down on and standing up from a chair with arms (e.g., wheelchair, bedside commode, etc,.)?: A Little Help needed moving to and from a bed to chair (including a wheelchair)?: A Little Help needed walking in hospital room?: A Little Help needed climbing 3-5 steps with a railing? : A Little 6 Click Score: 18    End of Session Equipment Utilized During Treatment: Gait belt Activity  Tolerance: Patient tolerated treatment well Patient left: in chair;with call bell/phone within reach   PT Visit Diagnosis: Pain;Difficulty in walking, not elsewhere classified (R26.2);Other abnormalities of gait and mobility (R26.89) Pain - Right/Left: Right Pain - part of body: Knee     Time: [id number] PT Time Calculation (min) (ACUTE ONLY): 36 min  Charges:  $Gait Training: 8-22 mins $Therapeutic Exercise: 8-22 mins                         Rebeca AlertJannie Amali Uhls, MPT Pager: [phone]

## 2018-06-07 DIAGNOSIS — M25561 Pain in right knee: Secondary | ICD-10-CM | POA: Diagnosis not present

## 2018-06-07 DIAGNOSIS — R262 Difficulty in walking, not elsewhere classified: Secondary | ICD-10-CM | POA: Diagnosis not present

## 2018-06-07 DIAGNOSIS — R2689 Other abnormalities of gait and mobility: Secondary | ICD-10-CM | POA: Diagnosis not present

## 2018-06-07 DIAGNOSIS — M25571 Pain in right ankle and joints of right foot: Secondary | ICD-10-CM | POA: Diagnosis not present

## 2018-06-10 DIAGNOSIS — R262 Difficulty in walking, not elsewhere classified: Secondary | ICD-10-CM | POA: Diagnosis not present

## 2018-06-10 DIAGNOSIS — R2689 Other abnormalities of gait and mobility: Secondary | ICD-10-CM | POA: Diagnosis not present

## 2018-06-10 DIAGNOSIS — M25561 Pain in right knee: Secondary | ICD-10-CM | POA: Diagnosis not present

## 2018-06-10 DIAGNOSIS — M25571 Pain in right ankle and joints of right foot: Secondary | ICD-10-CM | POA: Diagnosis not present

## 2018-06-10 NOTE — Discharge Summary (Signed)
Physician Discharge Summary  Patient ID: Nicole Stephenson MRN: [id number] DOB/AGE: [birth year] 72 y.o.  Admit date: 06/04/2018 Discharge date: 06/05/2018   Procedures:  Procedure(s) (LRB): RIGHT TOTAL KNEE ARTHROPLASTY (Right)  Attending Physician:  Dr. Durene Romans   Admission Diagnoses:   Right knee primary OA / pain  Discharge Diagnoses:  Principal Problem:   S/P right TKA Active Problems:   Overweight (BMI 25.0-29.9)  Past Medical History:  Diagnosis Date  . Allergic rhinitis, seasonal   . Anemia   . Anxiety   . Arthritis   . Asthma    exercise induced  . Chronic kidney disease    Stage III  . Constipation   . Depression   . DJD (degenerative joint disease)   . DVT (deep venous thrombosis) (HCC) 1997  . Dyspnea    with exertion  . GERD (gastroesophageal reflux disease)   . 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 History of kidney stones    passed x 2  . Hypertension   . Pneumonia     HPI:    Nicole Stephenson, 72 y.o. female, has a history of pain and functional disability in the right knee due to arthritis and has failed non-surgical conservative treatments for greater than 12 weeks to includeNSAID's and/or analgesics, corticosteriod injections, viscosupplementation injections and activity modification.  Onset of symptoms was gradual, starting 14 years ago with gradually worsening course since that time. The patient noted prior procedures on the knee to include  arthroplasty on the left knee in April 2017.  Patient currently rates pain in the right knee(s) at 9 out of 10 with activity. Patient has night pain, worsening of pain with activity and weight bearing, pain that interferes with activities of daily living, pain with passive range of motion, crepitus and joint swelling.  Patient has evidence of periarticular osteophytes and joint space narrowing by imaging studies. There is no active infection.  Risks, benefits and expectations were discussed with the patient.  Risks including  but not limited to the risk of anesthesia, blood clots, nerve damage, blood vessel damage, failure of the prosthesis, infection and up to and including death.  Patient understand the risks, benefits and expectations and wishes to proceed with surgery.   PCP: Chilton Greathouse, MD   Discharged Condition: good  Hospital Course:  Patient underwent the above stated procedure on 06/04/2018. Patient tolerated the procedure well and brought to the recovery room in good condition and subsequently to the floor.  POD #1 BP: 142/79 ; Pulse: 75 ; Temp: 98.1 F (36.7 C) ; Resp: 16 Patient reports pain as mild, controlled with medication.  No events throughout the night. States that she feels good and looking forward to recovering.  Ready to be discharged home. Dorsiflexion/plantar flexion intact, incision: dressing C/D/I, no cellulitis present and compartment soft.   LABS  Basename    HGB     9.5  HCT     28.6    Discharge Exam: General appearance: alert, cooperative and no distress Extremities: Homans sign is negative, no sign of DVT, no edema, redness or tenderness in the calves or thighs and no ulcers, gangrene or trophic changes  Disposition:  Home with follow up in 2 weeks   Follow-up Information    Durene Romans, MD. Schedule an appointment as soon as possible for a visit in 2 weeks.   Specialty:  Orthopedic Surgery Contact information: [address] [place] [place] [address] [phone]  Discharge Instructions    Call MD / Call 911   Complete by:  As directed    If you experience chest pain or shortness of breath, CALL 911 and be transported to the hospital emergency room.  If you develope a fever above 101 F, pus (white drainage) or increased drainage or redness at the wound, or calf pain, call your surgeon's office.   Change dressing   Complete by:  As directed    Maintain surgical dressing until follow up in the clinic. If the edges start to pull up, may  reinforce with tape. If the dressing is no longer working, may remove and cover with gauze and tape, but must keep the area dry and clean.  Call with any questions or concerns.   Constipation Prevention   Complete by:  As directed    Drink plenty of fluids.  Prune juice may be helpful.  You may use a stool softener, such as Colace (over the counter) 100 mg twice a day.  Use MiraLax (over the counter) for constipation as needed.   Diet - low sodium heart healthy   Complete by:  As directed    Discharge instructions   Complete by:  As directed    Maintain surgical dressing until follow up in the clinic. If the edges start to pull up, may reinforce with tape. If the dressing is no longer working, may remove and cover with gauze and tape, but must keep the area dry and clean.  [facility] Call with any questions or concerns.   Increase activity slowly as tolerated   Complete by:  As directed    Weight bearing as tolerated with assist device (walker, cane, etc) as directed, use it as long as suggested by your surgeon or therapist, typically at least 4-6 weeks.   TED hose   Complete by:  As directed    Use stockings (TED hose) for 2 weeks on both leg(s).  You may remove them at night for sleeping.      Allergies as of 06/05/2018      Reactions   Valium [diazepam] Other (See Comments)   Urinary incontinence, stumbling, falling Pt does NOT want to have this medication again   Hydrocodone Other (See Comments)   Urinary incontinence, stumbling, falling   Oxycodone Other (See Comments)   Urinary incontinence, stumbling, falling   Penicillins Itching, Swelling, Other (See Comments)   > > > PATIENT HAS RECEIVED ANCEF MULTIPLE TIMES WITHOUT REACTION < < <     OK TO GIVE ANCEF, IF ORDERED, PER MD.  04/12/17 Arm swelling PATIENT HAD A PCN REACTION WITH IMMEDIATE RASH, FACIAL/TONGUE/THROAT SWELLING, SOB, OR LIGHTHEADEDNESS WITH HYPOTENSION:  #  #  #  YES  #  #  #     Has patient had a PCN reaction causing severe rash involving mucus membranes or skin necrosis: UNSPECIFIED   Has patient had a PCN reaction that required hospitalization No   Lisinopril Cough   Causes Coughing      Medication List    STOP taking these medications   acetaminophen 500 MG tablet Commonly known as:  TYLENOL     TAKE these medications   amLODipine 10 MG tablet Commonly known as:  NORVASC Take 10 mg by mouth daily.   aspirin 81 MG chewable tablet Chew 1 tablet (81 mg total) by mouth 2 (two) times daily. Start the day after finishing the Xarelto. Take for 4 weeks, then resume  regular dose. Start taking on:  06/21/2018   buPROPion 150 MG 12 hr tablet Commonly known as:  WELLBUTRIN SR Take 150 mg by mouth every morning.   cetirizine 10 MG tablet Commonly known as:  ZYRTEC Take 10 mg by mouth every morning.   docusate sodium 100 MG capsule Commonly known as:  COLACE Take 1 capsule (100 mg total) by mouth 2 (two) times daily.   DULoxetine 30 MG capsule Commonly known as:  CYMBALTA Take 30 mg by mouth daily.   ferrous sulfate 325 (65 FE) MG tablet Take 1 tablet (325 mg total) by mouth 3 (three) times daily with meals.   fluticasone 50 MCG/ACT nasal spray Commonly known as:  FLONASE Place 2 sprays into both nostrils 2 (two) times daily as needed for allergies or rhinitis.   hydrochlorothiazide 25 MG tablet Commonly known as:  HYDRODIURIL Take 25 mg by mouth every morning.   HYDROcodone-acetaminophen 7.5-325 MG tablet Commonly known as:  NORCO Take 1-2 tablets by mouth every 4 (four) hours as needed for moderate pain.   irbesartan 150 MG tablet Commonly known as:  AVAPRO Take 150 mg by mouth daily.   meclizine 25 MG tablet Commonly known as:  ANTIVERT Take 25 mg by mouth every 6 (six) hours as needed for dizziness.   Melatonin 10 MG Tabs Take 10 mg by mouth at bedtime.   methocarbamol 500 MG tablet Commonly known as:  ROBAXIN Take 1 tablet (500 mg  total) by mouth every 6 (six) hours as needed for muscle spasms.   ondansetron 4 MG tablet Commonly known as:  ZOFRAN Take 1 tablet (4 mg total) by mouth every 6 (six) hours as needed for nausea or vomiting.   pantoprazole 40 MG tablet Commonly known as:  PROTONIX Take 40 mg by mouth 2 (two) times daily.   polyethylene glycol packet Commonly known as:  MIRALAX / GLYCOLAX Take 17 g by mouth 2 (two) times daily.   pravastatin 40 MG tablet Commonly known as:  PRAVACHOL Take 40 mg by mouth every morning.   rivaroxaban 10 MG Tabs tablet Commonly known as:  XARELTO Take 1 tablet (10 mg total) by mouth daily for 14 days. Take for 14 days then start ASA.   traZODone 50 MG tablet Commonly known as:  DESYREL Take 50 mg by mouth at bedtime. Sleep.   valACYclovir 500 MG tablet Commonly known as:  VALTREX TAKE 1 TABLET BY MOUTH TWICE DAILY AS NEEDED FOR 5 DAYS FOR OUTBREAKS            Discharge Care Instructions  (From admission, onward)         Start     Ordered   06/05/18 0000  Change dressing    Comments:  Maintain surgical dressing until follow up in the clinic. If the edges start to pull up, may reinforce with tape. If the dressing is no longer working, may remove and cover with gauze and tape, but must keep the area dry and clean.  Call with any questions or concerns.   06/05/18 2130           Signed: Anastasio Auerbach. Plummer Matich   PA-C  06/10/2018, 11:14 AM

## 2018-06-14 DIAGNOSIS — M25561 Pain in right knee: Secondary | ICD-10-CM | POA: Diagnosis not present

## 2018-06-18 DIAGNOSIS — M25561 Pain in right knee: Secondary | ICD-10-CM | POA: Diagnosis not present

## 2018-06-18 DIAGNOSIS — R2689 Other abnormalities of gait and mobility: Secondary | ICD-10-CM | POA: Diagnosis not present

## 2018-06-18 DIAGNOSIS — R262 Difficulty in walking, not elsewhere classified: Secondary | ICD-10-CM | POA: Diagnosis not present

## 2018-06-21 DIAGNOSIS — R262 Difficulty in walking, not elsewhere classified: Secondary | ICD-10-CM | POA: Diagnosis not present

## 2018-06-21 DIAGNOSIS — M25561 Pain in right knee: Secondary | ICD-10-CM | POA: Diagnosis not present

## 2018-06-21 DIAGNOSIS — R2689 Other abnormalities of gait and mobility: Secondary | ICD-10-CM | POA: Diagnosis not present

## 2018-06-24 DIAGNOSIS — R262 Difficulty in walking, not elsewhere classified: Secondary | ICD-10-CM | POA: Diagnosis not present

## 2018-06-24 DIAGNOSIS — R2689 Other abnormalities of gait and mobility: Secondary | ICD-10-CM | POA: Diagnosis not present

## 2018-06-24 DIAGNOSIS — M25561 Pain in right knee: Secondary | ICD-10-CM | POA: Diagnosis not present

## 2018-06-27 DIAGNOSIS — M25561 Pain in right knee: Secondary | ICD-10-CM | POA: Diagnosis not present

## 2018-06-27 DIAGNOSIS — R262 Difficulty in walking, not elsewhere classified: Secondary | ICD-10-CM | POA: Diagnosis not present

## 2018-06-27 DIAGNOSIS — R2689 Other abnormalities of gait and mobility: Secondary | ICD-10-CM | POA: Diagnosis not present

## 2018-07-04 DIAGNOSIS — J00 Acute nasopharyngitis [common cold]: Secondary | ICD-10-CM | POA: Diagnosis not present

## 2018-07-04 DIAGNOSIS — R0600 Dyspnea, unspecified: Secondary | ICD-10-CM | POA: Diagnosis not present

## 2018-07-04 DIAGNOSIS — R05 Cough: Secondary | ICD-10-CM | POA: Diagnosis not present

## 2018-07-04 DIAGNOSIS — Z6830 Body mass index (BMI) 30.0-30.9, adult: Secondary | ICD-10-CM | POA: Diagnosis not present

## 2018-07-07 ENCOUNTER — Emergency Department (HOSPITAL_COMMUNITY): Payer: Medicare Other

## 2018-07-07 ENCOUNTER — Emergency Department (HOSPITAL_COMMUNITY)
Admission: EM | Admit: 2018-07-07 | Discharge: 2018-07-07 | Disposition: A | Discharge status: Home / Self Care | Payer: Medicare Other | Attending: Emergency Medicine | Admitting: Emergency Medicine

## 2018-07-07 ENCOUNTER — Other Ambulatory Visit: Payer: Self-pay

## 2018-07-07 DIAGNOSIS — S8991XA Unspecified injury of right lower leg, initial encounter: Secondary | ICD-10-CM | POA: Diagnosis present

## 2018-07-07 DIAGNOSIS — Y939 Activity, unspecified: Secondary | ICD-10-CM | POA: Insufficient documentation

## 2018-07-07 DIAGNOSIS — S82091A Other fracture of right patella, initial encounter for closed fracture: Secondary | ICD-10-CM | POA: Insufficient documentation

## 2018-07-07 DIAGNOSIS — Z7901 Long term (current) use of anticoagulants: Secondary | ICD-10-CM | POA: Diagnosis not present

## 2018-07-07 DIAGNOSIS — Y999 Unspecified external cause status: Secondary | ICD-10-CM | POA: Insufficient documentation

## 2018-07-07 DIAGNOSIS — Z96653 Presence of artificial knee joint, bilateral: Secondary | ICD-10-CM | POA: Diagnosis not present

## 2018-07-07 DIAGNOSIS — M25561 Pain in right knee: Secondary | ICD-10-CM | POA: Diagnosis not present

## 2018-07-07 DIAGNOSIS — Z79899 Other long term (current) drug therapy: Secondary | ICD-10-CM | POA: Diagnosis not present

## 2018-07-07 DIAGNOSIS — X509XXA Other and unspecified overexertion or strenuous movements or postures, initial encounter: Secondary | ICD-10-CM | POA: Diagnosis not present

## 2018-07-07 DIAGNOSIS — Y929 Unspecified place or not applicable: Secondary | ICD-10-CM | POA: Diagnosis not present

## 2018-07-07 DIAGNOSIS — I129 Hypertensive chronic kidney disease with stage 1 through stage 4 chronic kidney disease, or unspecified chronic kidney disease: Secondary | ICD-10-CM | POA: Diagnosis not present

## 2018-07-07 DIAGNOSIS — N183 Chronic kidney disease, stage 3 (moderate): Secondary | ICD-10-CM | POA: Diagnosis not present

## 2018-07-07 MED ORDER — HYDROCODONE-ACETAMINOPHEN 5-325 MG PO TABS
1.0000 | ORAL_TABLET | Freq: Once | ORAL | Status: AC
  Administered 2018-07-07: 1 via ORAL
  Filled 2018-07-07: qty 1

## 2018-07-07 MED ORDER — HYDROCODONE-ACETAMINOPHEN 5-325 MG PO TABS: 1.0000 | ORAL_TABLET | ORAL | 0 refills | Status: DC | PRN

## 2018-07-07 NOTE — Discharge Instructions (Addendum)
There was evidence of a fracture to the patella (kneecap).  Be sure to wear the knee immobilizer at all times, but especially when ambulating.  You will be weightbearing as tolerated as long as you are wearing the knee immobilizer.  You will follow-up with orthopedics within the next 2 days.  If you have not heard from somebody from the orthopedic practice by tomorrow afternoon, please contact them directly.

## 2018-07-07 NOTE — ED Provider Notes (Signed)
[facility]-EMERGENCY DEPT Provider Note   CSN: [id number] Arrival date & time: 07/07/18  1905     History   Chief Complaint Chief Complaint  Patient presents with  . Knee Pain    HPI Nicole Stephenson is a 72 y.o. female.  HPI   Nicole Stephenson is a 72 y.o. female, with a history of right knee arthroplasty, DVT, asthma, CKD stage III, and HTN, presenting to the ED with right knee pain beginning around 12:30 PM today.  Patient states she tried to stand up from a seated position when she felt a pop and had sudden onset of pain to the knee, mostly medially, severe, radiating distally.  Accompanied by tingling into the right lower leg.  States she took one of her leftover hydrocodone/APAP right after an injury occurred.  She has ambulated since the incident occurred, though painful. States she had right knee total arthroplasty performed by Dr. Charlann Boxer June 04, 2018.  Denies weakness, numbness, subsequent falls, hip pain, or any other complaints.    Past Medical History:  Diagnosis Date  . Allergic rhinitis, seasonal   . Anemia   . Anxiety   . Arthritis   . Asthma    exercise induced  . Chronic kidney disease    Stage III  . Constipation   . Depression   . DJD (degenerative joint disease)   . DVT (deep venous thrombosis) (HCC) 1997  . Dyspnea    with exertion  . GERD (gastroesophageal reflux disease)   . 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 History of kidney stones    passed x 2  . Hypertension   . Pneumonia     Patient Active Problem List   Diagnosis Date Noted  . Overweight (BMI 25.0-29.9) 06/05/2018  . S/P right TKA 06/04/2018  . 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 Obese 02/09/2016  . S/P left TKA 02/08/2016    Past Surgical History:  Procedure Laterality Date  . bottom of left foot for keloids - 1993    . COLONOSCOPY    . REVERSE SHOULDER ARTHROPLASTY Left 04/12/2017   Procedure: REVERSE SHOULDER ARTHROPLASTY;  Surgeon: Francena Hanly, MD;  Location: [facility] OR;  Service: Orthopedics;  Laterality: Left;  . REVISION TOTAL SHOULDER TO REVERSE TOTAL SHOULDER Left 05/24/2017   Procedure: Revision left reverse shoulder arthroplasty;  Surgeon: Francena Hanly, MD;  Location: [facility] OR;  Service: Orthopedics;  Laterality: Left;  . right foot surgery ,rod inserted in lateral side of foot    . SHOULDER CLOSED REDUCTION Left 05/03/2017   Procedure: Closed reduction left shoulder ;  Surgeon: Francena Hanly, MD;  Location: [facility] OR;  Service: Orthopedics;  Laterality: Left;  . TOTAL KNEE ARTHROPLASTY Left 02/08/2016   Procedure: LEFT TOTAL KNEE ARTHROPLASTY;  Surgeon: Durene Romans, MD;  Location: [facility] ORS;  Service: Orthopedics;  Laterality: Left;  . TOTAL KNEE ARTHROPLASTY Right 06/04/2018   Procedure: RIGHT TOTAL KNEE ARTHROPLASTY;  Surgeon: Durene Romans, MD;  Location: [facility] ORS;  Service: Orthopedics;  Laterality: Right;  Adductor Block  . TUBAL LIGATION       OB History   None      Home Medications    Prior to Admission medications   Medication Sig Start Date End Date Taking? Authorizing Provider  amLODipine (NORVASC) 10 MG tablet Take 10 mg by mouth daily. 01/02/16   [provider]  aspirin (ASPIRIN CHILDRENS) 81 MG chewable tablet Chew 1 tablet (81 mg total) by mouth  2 (two) times daily. Start the day after finishing the Xarelto. Take for 4 weeks, then resume regular dose. 06/21/18 07/21/18  Lanney GinsBabish, Matthew, PA-C  buPROPion (WELLBUTRIN SR) 150 MG 12 hr tablet Take 150 mg by mouth every morning. 01/03/16   [provider]  cetirizine (ZYRTEC) 10 MG tablet Take 10 mg by mouth every morning.    [provider]  docusate sodium (COLACE) 100 MG capsule Take 1 capsule (100 mg total) by mouth 2 (two) times daily. 06/04/18   Lanney GinsBabish, Matthew, PA-C  DULoxetine (CYMBALTA) 30 MG capsule Take 30 mg by mouth daily. 12/20/15   [provider]  ferrous sulfate (FERROUSUL) 325 (65 FE) MG tablet Take 1 tablet (325 mg total) by  mouth 3 (three) times daily with meals. 06/04/18   Lanney GinsBabish, Matthew, PA-C  fluticasone (FLONASE) 50 MCG/ACT nasal spray Place 2 sprays into both nostrils 2 (two) times daily as needed for allergies or rhinitis.    [provider]  hydrochlorothiazide (HYDRODIURIL) 25 MG tablet Take 25 mg by mouth every morning.    [provider]  HYDROcodone-acetaminophen (NORCO) 7.5-325 MG tablet Take 1-2 tablets by mouth every 4 (four) hours as needed for moderate pain. 06/05/18   Lanney GinsBabish, Matthew, PA-C  HYDROcodone-acetaminophen (NORCO/VICODIN) 5-325 MG tablet Take 1 tablet by mouth every 4 (four) hours as needed for severe pain. 07/07/18   Akelia Husted C, PA-C  irbesartan (AVAPRO) 150 MG tablet Take 150 mg by mouth daily.    [provider]  meclizine (ANTIVERT) 25 MG tablet Take 25 mg by mouth every 6 (six) hours as needed for dizziness.    [provider]  Melatonin 10 MG TABS Take 10 mg by mouth at bedtime.    [provider]  methocarbamol (ROBAXIN) 500 MG tablet Take 1 tablet (500 mg total) by mouth every 6 (six) hours as needed for muscle spasms. 06/05/18   Lanney GinsBabish, Matthew, PA-C  ondansetron (ZOFRAN) 4 MG tablet Take 1 tablet (4 mg total) by mouth every 6 (six) hours as needed for nausea or vomiting. 06/05/18   Lanney GinsBabish, Matthew, PA-C  pantoprazole (PROTONIX) 40 MG tablet Take 40 mg by mouth 2 (two) times daily.    [provider]  polyethylene glycol (MIRALAX / GLYCOLAX) packet Take 17 g by mouth 2 (two) times daily. 06/04/18   Lanney GinsBabish, Matthew, PA-C  pravastatin (PRAVACHOL) 40 MG tablet Take 40 mg by mouth every morning.    [provider]  rivaroxaban (XARELTO) 10 MG TABS tablet Take 1 tablet (10 mg total) by mouth daily for 14 days. Take for 14 days then start ASA. 06/06/18 06/20/18  Lanney GinsBabish, Matthew, PA-C  traZODone (DESYREL) 50 MG tablet Take 50 mg by mouth at bedtime. Sleep.  12/27/15   [provider]  valACYclovir (VALTREX) 500 MG tablet TAKE 1  TABLET BY MOUTH TWICE DAILY AS NEEDED FOR 5 DAYS FOR OUTBREAKS 03/26/17   [provider]    Family History Family History  Problem Relation Age of Onset  . Hypertension Mother   . Heart disease Mother   . Stroke Mother   . Hypertension Father     Social History Social History   Tobacco Use  . Smoking status: Never Smoker  . Smokeless tobacco: Never Used  Substance Use Topics  . Alcohol use: No  . Drug use: No     Allergies   Valium [diazepam]; Hydrocodone; Oxycodone; Penicillins; and Lisinopril  Note: Patient states she can take hydrocodone or oxycodone, however, cannot take  either of these medications with Valium.  Review of Systems Review of Systems  Gastrointestinal: Negative for nausea and vomiting.  Musculoskeletal: Positive for arthralgias and joint swelling.  Neurological: Negative for weakness and numbness.     Physical Exam Updated Vital Signs BP 122/60 (BP Location: Left Arm)   Pulse 83   Temp 99 F (37.2 C) (Oral)   Resp 16   SpO2 100%   Physical Exam  Constitutional: She appears well-developed and well-nourished. No distress.  HENT:  Head: Normocephalic and atraumatic.  Eyes: Conjunctivae are normal.  Neck: Neck supple.  Cardiovascular: Normal rate, regular rhythm and intact distal pulses.  Pulses:      Dorsalis pedis pulses are 2+ on the right side.       Posterior tibial pulses are 2+ on the right side.  Pulmonary/Chest: Effort normal.  Musculoskeletal:       Right knee: She exhibits swelling. Tenderness found.  Tenderness to the right anterior knee at the inferior pole of the patella with associated edema. Patient can flex and extend at the knee, but with a large amount of pain.  There is no noted laxity or deformity.  Neurological: She is alert.  Skin: Skin is warm and dry. She is not diaphoretic. No pallor.  Psychiatric: She has a normal mood and affect. Her behavior is normal.  Nursing note and vitals reviewed.    ED  Treatments / Results  Labs (all labs ordered are listed, but only abnormal results are displayed) Labs Reviewed - No data to display  EKG None  Radiology Dg Knee Complete 4 Views Right  Result Date: 07/07/2018 CLINICAL DATA:  Recent right total knee arthroplasty. Popping sound in the right knee. Right knee pain. EXAM: RIGHT KNEE - COMPLETE 4+ VIEW COMPARISON:  None. FINDINGS: Status post right total knee arthroplasty, with no evidence of hardware fracture or loosening. There is patella alta with apparent fracture of inferior patellar enthesophyte, which is 17 mm inferiorly displaced from the patella. No additional fracture. No suspicious focal osseous lesions. No dislocation. Small suprapatellar right knee joint effusion. IMPRESSION: 1. Patella alta. Apparent fracture of inferior patellar enthesophyte at the lower tip of the patella. 2. Small suprapatellar right knee joint effusion. 3. No evidence of hardware complication. Electronically Signed   By: Delbert Phenix M.D.   On: 07/07/2018 20:10    Procedures Procedures (including critical care time)  Medications Ordered in ED Medications  HYDROcodone-acetaminophen (NORCO/VICODIN) 5-325 MG per tablet 1 tablet (1 tablet Oral Given 07/07/18 2250)     Initial Impression / Assessment and Plan / ED Course  I have reviewed the triage vital signs and the nursing notes.  Pertinent labs & imaging results that were available during my care of the patient were reviewed by me and considered in my medical decision making (see chart for details).  Clinical Course as of Jul 07 2308  Wynelle Link Jul 07, 2018  2140 Spoke with Dr. Ranell Patrick, on call for [facility].  Needs to be seen in the office tomorrow or Tuesday.  He will contact Dr. Constance Goltz in the morning and come up with a plan for an appointment time. Knee immobilizer at all times, weightbearing as tolerated.  Add additional analgesia to the patient's regimen.   [SJ]    Clinical Course User Index [SJ] Narcissus Detwiler,  Trisha Morandi C, PA-C    Patient presents with sudden onset of right knee pain.  Patellar fracture noted on x-ray.  Knee immobilizer and close orthopedic follow-up.  Patient demonstrated ability  to ambulate prior to discharge.   Findings and plan of care discussed with Chaney Malling, MD. Dr. Silverio Lay personally evaluated and examined this patient.   Final Clinical Impressions(s) / ED Diagnoses   Final diagnoses:  Other closed fracture of right patella, initial encounter    ED Discharge Orders         Ordered    HYDROcodone-acetaminophen (NORCO/VICODIN) 5-325 MG tablet  Every 4 hours PRN     07/07/18 2151           Anselm Pancoast, PA-C 07/07/18 2312    Charlynne Pander, MD 07/07/18 [phone]

## 2018-07-07 NOTE — ED Notes (Signed)
Bed: WTR8 Expected date:  Expected time:  Means of arrival:  Comments: 

## 2018-07-07 NOTE — ED Triage Notes (Signed)
Pt recently had a knee replacement august 20th, and today when she was in the shower she heard a popping sound in her right knee and and is having a lot of pain to the area.

## 2018-07-09 NOTE — Progress Notes (Signed)
Please place orders in Epic as patient is being scheduled for a pre-op appointment! Thank you! 

## 2018-07-15 NOTE — Patient Instructions (Addendum)
MOE BRIER  07/15/2018   Your procedure is scheduled on: 07-23-18 Tuesday  Report to [facility] Main  Entrance              Report to admitting at 835 AM    Call this number if you have problems the morning of surgery [phone]    Remember: Do not eat food or drink liquids :After Midnight.             BRUSH YOUR TEETH MORNING OF SURGERY AND RINSE YOUR MOUTH OUT, NO CHEWING GUM CANDY OR MINTS.     Take these medicines the morning of surgery with A SIP OF WATER: amlodipine (norvasc), hydrocodone if needed, bupropion (wellbutrin), duloxetine (cymbalta), pravastatin (pravachol), pantaprazole (protonix)                                You may not have any metal on your body including hair pins and              piercings  Do not wear jewelry, make-up, lotions, powders or perfumes, deodorant             Do not wear nail polish.  Do not shave  48 hours prior to surgery.               Do not bring valuables to the hospital. [place] IS NOT             RESPONSIBLE   FOR VALUABLES.  Contacts, dentures or bridgework may not be worn into surgery.  Leave suitcase in the car. After surgery it may be brought to your room.                  Please read over the following fact sheets you were given: _____________________________________________________________________             [facility] - Preparing for Surgery Before surgery, you can play an important role.  Because skin is not sterile, your skin needs to be as free of germs as possible.  You can reduce the number of germs on your skin by washing with CHG (chlorahexidine gluconate) soap before surgery.  CHG is an antiseptic cleaner which kills germs and bonds with the skin to continue killing germs even after washing. Please DO NOT use if you have an allergy to CHG or antibacterial soaps.  If your skin becomes reddened/irritated stop using the CHG and inform your nurse when you arrive at Short Stay. Do not  shave (including legs and underarms) for at least 48 hours prior to the first CHG shower.  You may shave your face/neck. Please follow these instructions carefully:  1.  Shower with CHG Soap the night before surgery and the  morning of Surgery.  2.  If you choose to wash your hair, wash your hair first as usual with your  normal  shampoo.  3.  After you shampoo, rinse your hair and body thoroughly to remove the  shampoo.                           4.  Use CHG as you would any other liquid soap.  You can apply chg directly  to the skin and wash  Gently with a scrungie or clean washcloth.  5.  Apply the CHG Soap to your body ONLY FROM THE NECK DOWN.   Do not use on face/ open                           Wound or open sores. Avoid contact with eyes, ears mouth and genitals (private parts).                       Wash face,  Genitals (private parts) with your normal soap.             6.  Wash thoroughly, paying special attention to the area where your surgery  will be performed.  7.  Thoroughly rinse your body with warm water from the neck down.  8.  DO NOT shower/wash with your normal soap after using and rinsing off  the CHG Soap.                9.  Pat yourself dry with a clean towel.            10.  Wear clean pajamas.            11.  Place clean sheets on your bed the night of your first shower and do not  sleep with pets. Day of Surgery : Do not apply any lotions/deodorants the morning of surgery.  Please wear clean clothes to the hospital/surgery center.  FAILURE TO FOLLOW THESE INSTRUCTIONS MAY RESULT IN THE CANCELLATION OF YOUR SURGERY PATIENT SIGNATURE_________________________________  NURSE SIGNATURE__________________________________  ________________________________________________________________________   [name]  An incentive spirometer is a tool that can help keep your lungs clear and active. This tool measures how well you are filling your lungs  with each breath. Taking long deep breaths may help reverse or decrease the chance of developing breathing (pulmonary) problems (especially infection) following:  A long period of time when you are unable to move or be active. BEFORE THE PROCEDURE   If the spirometer includes an indicator to show your best effort, your nurse or respiratory therapist will set it to a desired goal.  If possible, sit up straight or lean slightly forward. Try not to slouch.  Hold the incentive spirometer in an upright position. INSTRUCTIONS FOR USE  1. Sit on the edge of your bed if possible, or sit up as far as you can in bed or on a chair. 2. Hold the incentive spirometer in an upright position. 3. Breathe out normally. 4. Place the mouthpiece in your mouth and seal your lips tightly around it. 5. Breathe in slowly and as deeply as possible, raising the piston or the ball toward the top of the column. 6. Hold your breath for 3-5 seconds or for as long as possible. Allow the piston or ball to fall to the bottom of the column. 7. Remove the mouthpiece from your mouth and breathe out normally. 8. Rest for a few seconds and repeat Steps 1 through 7 at least 10 times every 1-2 hours when you are awake. Take your time and take a few normal breaths between deep breaths. 9. The spirometer may include an indicator to show your best effort. Use the indicator as a goal to work toward during each repetition. 10. After each set of 10 deep breaths, practice coughing to be sure your lungs are clear. If you have an incision (the cut made at the time of surgery),  support your incision when coughing by placing a pillow or rolled up towels firmly against it. Once you are able to get out of bed, walk around indoors and cough well. You may stop using the incentive spirometer when instructed by your caregiver.  RISKS AND COMPLICATIONS  Take your time so you do not get dizzy or light-headed.  If you are in pain, you may need to  take or ask for pain medication before doing incentive spirometry. It is harder to take a deep breath if you are having pain. AFTER USE  Rest and breathe slowly and easily.  It can be helpful to keep track of a log of your progress. Your caregiver can provide you with a simple table to help with this. If you are using the spirometer at home, follow these instructions: [place] IF:   You are having difficultly using the spirometer.  You have trouble using the spirometer as often as instructed.  Your pain medication is not giving enough relief while using the spirometer.  You develop fever of 100.5 F (38.1 C) or higher. SEEK IMMEDIATE MEDICAL CARE IF:   You cough up bloody sputum that had not been present before.  You develop fever of 102 F (38.9 C) or greater.  You develop worsening pain at or near the incision site. MAKE SURE YOU:   Understand these instructions.  Will watch your condition.  Will get help right away if you are not doing well or get worse. Document Released: 02/12/2007 Document Revised: 12/25/2011 Document Reviewed: 04/15/2007 Exit[employer] Patient Information 2014 [employer], [place].   ________________________________________________________________________  WHAT IS A BLOOD TRANSFUSION? Blood Transfusion Information  A transfusion is the replacement of blood or some of its parts. Blood is made up of multiple cells which provide different functions.  Red blood cells carry oxygen and are used for blood loss replacement.  White blood cells fight against infection.  Platelets control bleeding.  Plasma helps clot blood.  Other blood products are available for specialized needs, such as hemophilia or other clotting disorders. BEFORE THE TRANSFUSION  Who gives blood for transfusions?   Healthy volunteers who are fully evaluated to make sure their blood is safe. This is blood bank blood. Transfusion therapy is the safest it has ever been in the  practice of medicine. Before blood is taken from a donor, a complete history is taken to make sure that person has no history of diseases nor engages in risky social behavior (examples are intravenous drug use or sexual activity with multiple partners). The donor's travel history is screened to minimize risk of transmitting infections, such as malaria. The donated blood is tested for signs of infectious diseases, such as HIV and hepatitis. The blood is then tested to be sure it is compatible with you in order to minimize the chance of a transfusion reaction. If you or a relative donates blood, this is often done in anticipation of surgery and is not appropriate for emergency situations. It takes many days to process the donated blood. RISKS AND COMPLICATIONS Although transfusion therapy is very safe and saves many lives, the main dangers of transfusion include:   Getting an infectious disease.  Developing a transfusion reaction. This is an allergic reaction to something in the blood you were given. Every precaution is taken to prevent this. The decision to have a blood transfusion has been considered carefully by your caregiver before blood is given. Blood is not given unless the benefits outweigh the risks. AFTER THE TRANSFUSION  Right after receiving a blood transfusion, you will usually feel much better and more energetic. This is especially true if your red blood cells have gotten low (anemic). The transfusion raises the level of the red blood cells which carry oxygen, and this usually causes an energy increase.  The nurse administering the transfusion will monitor you carefully for complications. HOME CARE INSTRUCTIONS  No special instructions are needed after a transfusion. You may find your energy is better. Speak with your caregiver about any limitations on activity for underlying diseases you may have. SEEK MEDICAL CARE IF:   Your condition is not improving after your transfusion.  You  develop redness or irritation at the intravenous (IV) site. SEEK IMMEDIATE MEDICAL CARE IF:  Any of the following symptoms occur over the next 12 hours:  Shaking chills.  You have a temperature by mouth above 102 F (38.9 C), not controlled by medicine.  Chest, back, or muscle pain.  People around you feel you are not acting correctly or are confused.  Shortness of breath or difficulty breathing.  Dizziness and fainting.  You get a rash or develop hives.  You have a decrease in urine output.  Your urine turns a dark color or changes to pink, red, or brown. Any of the following symptoms occur over the next 10 days:  You have a temperature by mouth above 102 F (38.9 C), not controlled by medicine.  Shortness of breath.  Weakness after normal activity.  The white part of the eye turns yellow (jaundice).  You have a decrease in the amount of urine or are urinating less often.  Your urine turns a dark color or changes to pink, red, or brown. Document Released: 09/29/2000 Document Revised: 12/25/2011 Document Reviewed: 05/18/2008 [facility] Patient Information 2014 [place], [place].  _______________________________________________________________________

## 2018-07-17 ENCOUNTER — Encounter (HOSPITAL_COMMUNITY)
Admission: RE | Admit: 2018-07-17 | Discharge: 2018-07-17 | Disposition: A | Discharge status: Home / Self Care | Payer: Medicare Other | Source: Ambulatory Visit | Attending: Orthopedic Surgery | Admitting: Orthopedic Surgery

## 2018-07-17 ENCOUNTER — Other Ambulatory Visit: Payer: Self-pay

## 2018-07-17 ENCOUNTER — Encounter (HOSPITAL_COMMUNITY): Payer: Self-pay

## 2018-07-17 DIAGNOSIS — I517 Cardiomegaly: Secondary | ICD-10-CM | POA: Diagnosis not present

## 2018-07-17 DIAGNOSIS — S82011D Displaced osteochondral fracture of right patella, subsequent encounter for closed fracture with routine healing: Secondary | ICD-10-CM | POA: Diagnosis not present

## 2018-07-17 DIAGNOSIS — I10 Essential (primary) hypertension: Secondary | ICD-10-CM | POA: Insufficient documentation

## 2018-07-17 DIAGNOSIS — Z01818 Encounter for other preprocedural examination: Secondary | ICD-10-CM | POA: Diagnosis not present

## 2018-07-17 LAB — BASIC METABOLIC PANEL
Anion gap: 8 (ref 5–15)
BUN: 17 mg/dL (ref 8–23)
CO2: 27 mmol/L (ref 22–32)
Calcium: 9.3 mg/dL (ref 8.9–10.3)
Chloride: 97 mmol/L — ABNORMAL LOW (ref 98–111)
Creatinine, Ser: 1.25 mg/dL — ABNORMAL HIGH (ref 0.44–1.00)
GFR calc Af Amer: 49 mL/min — ABNORMAL LOW (ref >60)
GFR calc non Af Amer: 42 mL/min — ABNORMAL LOW (ref >60)
Glucose, Bld: 100 mg/dL — ABNORMAL HIGH (ref 70–99)
Potassium: 4.6 mmol/L (ref 3.5–5.1)
Sodium: 132 mmol/L — ABNORMAL LOW (ref 135–145)

## 2018-07-17 LAB — CBC
HCT: 32.1 % — ABNORMAL LOW (ref 36.0–46.0)
Hemoglobin: 10.2 g/dL — ABNORMAL LOW (ref 12.0–15.0)
MCH: 30 pg (ref 26.0–34.0)
MCHC: 31.8 g/dL (ref 30.0–36.0)
MCV: 94.4 fL (ref 78.0–100.0)
Platelets: 454 10*3/uL — ABNORMAL HIGH (ref 150–400)
RBC: 3.4 MIL/uL — ABNORMAL LOW (ref 3.87–5.11)
RDW: 14.3 % (ref 11.5–15.5)
WBC: 5.3 10*3/uL (ref 4.0–10.5)

## 2018-07-18 DIAGNOSIS — R05 Cough: Secondary | ICD-10-CM | POA: Diagnosis not present

## 2018-07-18 DIAGNOSIS — Z6830 Body mass index (BMI) 30.0-30.9, adult: Secondary | ICD-10-CM | POA: Diagnosis not present

## 2018-07-18 DIAGNOSIS — J22 Unspecified acute lower respiratory infection: Secondary | ICD-10-CM | POA: Diagnosis not present

## 2018-07-22 MED ORDER — GENTAMICIN SULFATE 40 MG/ML IJ SOLN
5.0000 mg/kg | INTRAVENOUS | Status: AC
  Administered 2018-07-23: 340 mg via INTRAVENOUS
  Filled 2018-07-22: qty 8.5

## 2018-07-22 NOTE — Progress Notes (Signed)
Instructed patient to arrive for surgery at 0600. NPO after midnight except sips for AM meds (meds on PST instruction sheet). Nicole Stephenson verbalized understanding.

## 2018-07-22 NOTE — Anesthesia Preprocedure Evaluation (Addendum)
Anesthesia Evaluation  Patient identified by MRN, date of birth, ID band Patient awake    Reviewed: Allergy & Precautions, NPO status , Patient's Chart, lab work & pertinent test results  History of Anesthesia Complications Negative for: history of anesthetic complications  Airway Mallampati: II  TM Distance: >3 FB Neck ROM: Full    Dental no notable dental hx.    Pulmonary asthma (exercise-induced) ,    Pulmonary exam normal        Cardiovascular hypertension, + DVT  Normal cardiovascular exam     Neuro/Psych PSYCHIATRIC DISORDERS Anxiety Depression negative neurological ROS     GI/Hepatic Neg liver ROS, GERD  Medicated,  Endo/Other  negative endocrine ROS  Renal/GU Renal InsufficiencyRenal disease  negative genitourinary   Musculoskeletal  (+) Arthritis , Osteoarthritis,    Abdominal   Peds  Hematology  (+) anemia ,   Anesthesia Other Findings   Reproductive/Obstetrics                            Anesthesia Physical Anesthesia Plan  ASA: III  Anesthesia Plan: Spinal   Post-op Pain Management:  Regional for Post-op pain   Induction:   PONV Risk Score and Plan: 2 and Propofol infusion and Treatment may vary due to age or medical condition  Airway Management Planned: Natural Airway, Nasal Cannula and Simple Face Mask  Additional Equipment: None  Intra-op Plan:   Post-operative Plan:   Informed Consent: I have reviewed the patients History and Physical, chart, labs and discussed the procedure including the risks, benefits and alternatives for the proposed anesthesia with the patient or authorized representative who has indicated his/her understanding and acceptance.     Plan Discussed with:   Anesthesia Plan Comments:       Anesthesia Quick Evaluation

## 2018-07-23 ENCOUNTER — Inpatient Hospital Stay (HOSPITAL_COMMUNITY): Payer: Medicare Other | Admitting: Anesthesiology

## 2018-07-23 ENCOUNTER — Encounter (HOSPITAL_COMMUNITY): Payer: Self-pay | Admitting: *Deleted

## 2018-07-23 ENCOUNTER — Inpatient Hospital Stay (HOSPITAL_COMMUNITY)
Admission: RE | Admit: 2018-07-23 | Discharge: 2018-07-25 | DRG: 501 | Disposition: A | Discharge status: Home / Self Care | Payer: Medicare Other | Attending: Orthopedic Surgery | Admitting: Orthopedic Surgery

## 2018-07-23 ENCOUNTER — Other Ambulatory Visit: Payer: Self-pay

## 2018-07-23 ENCOUNTER — Encounter (HOSPITAL_COMMUNITY)
Admission: RE | Disposition: A | Discharge status: Home / Self Care | Payer: Self-pay | Source: Home / Self Care | Attending: Orthopedic Surgery

## 2018-07-23 DIAGNOSIS — Z96652 Presence of left artificial knee joint: Secondary | ICD-10-CM | POA: Diagnosis present

## 2018-07-23 DIAGNOSIS — W1830XA Fall on same level, unspecified, initial encounter: Secondary | ICD-10-CM | POA: Diagnosis present

## 2018-07-23 DIAGNOSIS — I129 Hypertensive chronic kidney disease with stage 1 through stage 4 chronic kidney disease, or unspecified chronic kidney disease: Secondary | ICD-10-CM | POA: Diagnosis not present

## 2018-07-23 DIAGNOSIS — F329 Major depressive disorder, single episode, unspecified: Secondary | ICD-10-CM | POA: Diagnosis not present

## 2018-07-23 DIAGNOSIS — Z87442 Personal history of urinary calculi: Secondary | ICD-10-CM | POA: Diagnosis not present

## 2018-07-23 DIAGNOSIS — Z888 Allergy status to other drugs, medicaments and biological substances status: Secondary | ICD-10-CM | POA: Diagnosis not present

## 2018-07-23 DIAGNOSIS — S82091A Other fracture of right patella, initial encounter for closed fracture: Secondary | ICD-10-CM | POA: Diagnosis present

## 2018-07-23 DIAGNOSIS — G8918 Other acute postprocedural pain: Secondary | ICD-10-CM | POA: Diagnosis not present

## 2018-07-23 DIAGNOSIS — Z880 Allergy status to penicillin: Secondary | ICD-10-CM

## 2018-07-23 DIAGNOSIS — K219 Gastro-esophageal reflux disease without esophagitis: Secondary | ICD-10-CM | POA: Diagnosis not present

## 2018-07-23 DIAGNOSIS — Z885 Allergy status to narcotic agent status: Secondary | ICD-10-CM | POA: Diagnosis not present

## 2018-07-23 DIAGNOSIS — Z96612 Presence of left artificial shoulder joint: Secondary | ICD-10-CM | POA: Diagnosis present

## 2018-07-23 DIAGNOSIS — J309 Allergic rhinitis, unspecified: Secondary | ICD-10-CM | POA: Diagnosis present

## 2018-07-23 DIAGNOSIS — T84012A Broken internal right knee prosthesis, initial encounter: Secondary | ICD-10-CM | POA: Diagnosis not present

## 2018-07-23 DIAGNOSIS — S86811A Strain of other muscle(s) and tendon(s) at lower leg level, right leg, initial encounter: Secondary | ICD-10-CM | POA: Diagnosis not present

## 2018-07-23 DIAGNOSIS — S82001A Unspecified fracture of right patella, initial encounter for closed fracture: Secondary | ICD-10-CM | POA: Diagnosis present

## 2018-07-23 DIAGNOSIS — S83094A Other dislocation of right patella, initial encounter: Secondary | ICD-10-CM | POA: Diagnosis not present

## 2018-07-23 DIAGNOSIS — Z86718 Personal history of other venous thrombosis and embolism: Secondary | ICD-10-CM

## 2018-07-23 DIAGNOSIS — N183 Chronic kidney disease, stage 3 (moderate): Secondary | ICD-10-CM | POA: Diagnosis not present

## 2018-07-23 DIAGNOSIS — F419 Anxiety disorder, unspecified: Secondary | ICD-10-CM | POA: Diagnosis not present

## 2018-07-23 HISTORY — PX: ORIF PATELLA: SHX5033

## 2018-07-23 LAB — TYPE AND SCREEN
ABO/RH(D): A POS
Antibody Screen: NEGATIVE

## 2018-07-23 SURGERY — OPEN REDUCTION INTERNAL FIXATION (ORIF) PATELLA
Anesthesia: Spinal | Site: Knee | Laterality: Right

## 2018-07-23 MED ORDER — ROPIVACAINE HCL 5 MG/ML IJ SOLN
INTRAMUSCULAR | Status: DC | PRN
  Administered 2018-07-23: 20 mL via PERINEURAL

## 2018-07-23 MED ORDER — FERROUS SULFATE 325 (65 FE) MG PO TABS
325.0000 mg | ORAL_TABLET | Freq: Two times a day (BID) | ORAL | Status: DC
  Administered 2018-07-23 – 2018-07-25 (×4): 325 mg via ORAL
  Filled 2018-07-23 (×4): qty 1

## 2018-07-23 MED ORDER — ONDANSETRON HCL 4 MG/2ML IJ SOLN
INTRAMUSCULAR | Status: AC
  Filled 2018-07-23: qty 2

## 2018-07-23 MED ORDER — SODIUM CHLORIDE 0.9 % IJ SOLN
INTRAMUSCULAR | Status: AC
  Filled 2018-07-23: qty 50

## 2018-07-23 MED ORDER — MORPHINE SULFATE (PF) 2 MG/ML IV SOLN: 0.5000 mg | INTRAVENOUS | Status: DC | PRN

## 2018-07-23 MED ORDER — METOCLOPRAMIDE HCL 5 MG/ML IJ SOLN: 5.0000 mg | Freq: Three times a day (TID) | INTRAMUSCULAR | Status: DC | PRN

## 2018-07-23 MED ORDER — VANCOMYCIN HCL IN DEXTROSE 1-5 GM/200ML-% IV SOLN
1000.0000 mg | Freq: Two times a day (BID) | INTRAVENOUS | Status: AC
  Administered 2018-07-23: 1000 mg via INTRAVENOUS
  Filled 2018-07-23: qty 200

## 2018-07-23 MED ORDER — PANTOPRAZOLE SODIUM 40 MG PO TBEC
40.0000 mg | DELAYED_RELEASE_TABLET | Freq: Two times a day (BID) | ORAL | Status: DC
  Administered 2018-07-23 – 2018-07-25 (×4): 40 mg via ORAL
  Filled 2018-07-23 (×4): qty 1

## 2018-07-23 MED ORDER — IRBESARTAN 150 MG PO TABS
150.0000 mg | ORAL_TABLET | Freq: Every day | ORAL | Status: DC
  Administered 2018-07-24 – 2018-07-25 (×2): 150 mg via ORAL
  Filled 2018-07-23 (×2): qty 1

## 2018-07-23 MED ORDER — MENTHOL 3 MG MT LOZG: 1.0000 | LOZENGE | OROMUCOSAL | Status: DC | PRN

## 2018-07-23 MED ORDER — HYDROCODONE-ACETAMINOPHEN 7.5-325 MG PO TABS: 1.0000 | ORAL_TABLET | ORAL | Status: DC | PRN

## 2018-07-23 MED ORDER — HYDROCHLOROTHIAZIDE 25 MG PO TABS
25.0000 mg | ORAL_TABLET | Freq: Every morning | ORAL | Status: DC
  Administered 2018-07-24 – 2018-07-25 (×2): 25 mg via ORAL
  Filled 2018-07-23 (×2): qty 1

## 2018-07-23 MED ORDER — FLUTICASONE PROPIONATE 50 MCG/ACT NA SUSP: 2.0000 | Freq: Two times a day (BID) | NASAL | Status: DC | PRN

## 2018-07-23 MED ORDER — MIDAZOLAM HCL 2 MG/2ML IJ SOLN
INTRAMUSCULAR | Status: AC
  Administered 2018-07-23: 1 mg
  Filled 2018-07-23: qty 2

## 2018-07-23 MED ORDER — PROPOFOL 500 MG/50ML IV EMUL
INTRAVENOUS | Status: DC | PRN
  Administered 2018-07-23: 100 ug/kg/min via INTRAVENOUS

## 2018-07-23 MED ORDER — BISACODYL 10 MG RE SUPP: 10.0000 mg | Freq: Every day | RECTAL | Status: DC | PRN

## 2018-07-23 MED ORDER — CELECOXIB 200 MG PO CAPS
200.0000 mg | ORAL_CAPSULE | Freq: Two times a day (BID) | ORAL | Status: DC
  Administered 2018-07-23 (×2): 200 mg via ORAL
  Filled 2018-07-23 (×2): qty 1

## 2018-07-23 MED ORDER — METHOCARBAMOL 500 MG PO TABS
500.0000 mg | ORAL_TABLET | Freq: Four times a day (QID) | ORAL | Status: DC | PRN
  Administered 2018-07-24 – 2018-07-25 (×3): 500 mg via ORAL
  Filled 2018-07-23 (×4): qty 1

## 2018-07-23 MED ORDER — KETOROLAC TROMETHAMINE 30 MG/ML IJ SOLN
INTRAMUSCULAR | Status: AC
  Filled 2018-07-23: qty 1

## 2018-07-23 MED ORDER — AMLODIPINE BESYLATE 10 MG PO TABS
10.0000 mg | ORAL_TABLET | Freq: Every day | ORAL | Status: DC
  Administered 2018-07-24 – 2018-07-25 (×2): 10 mg via ORAL
  Filled 2018-07-23: qty 1
  Filled 2018-07-23: qty 2

## 2018-07-23 MED ORDER — SODIUM CHLORIDE 0.9 % IV SOLN
INTRAVENOUS | Status: DC
  Administered 2018-07-23 – 2018-07-24 (×2): via INTRAVENOUS

## 2018-07-23 MED ORDER — LORATADINE 10 MG PO TABS
10.0000 mg | ORAL_TABLET | Freq: Every day | ORAL | Status: DC
  Administered 2018-07-23 – 2018-07-25 (×3): 10 mg via ORAL
  Filled 2018-07-23 (×3): qty 1

## 2018-07-23 MED ORDER — PHENOL 1.4 % MT LIQD: 1.0000 | OROMUCOSAL | Status: DC | PRN

## 2018-07-23 MED ORDER — POLYETHYLENE GLYCOL 3350 17 G PO PACK
17.0000 g | PACK | Freq: Two times a day (BID) | ORAL | Status: DC
  Filled 2018-07-23: qty 1

## 2018-07-23 MED ORDER — ACETAMINOPHEN 325 MG PO TABS: 325.0000 mg | ORAL_TABLET | Freq: Four times a day (QID) | ORAL | Status: DC | PRN

## 2018-07-23 MED ORDER — DOCUSATE SODIUM 100 MG PO CAPS
100.0000 mg | ORAL_CAPSULE | Freq: Two times a day (BID) | ORAL | Status: DC
  Administered 2018-07-23 – 2018-07-24 (×2): 100 mg via ORAL
  Filled 2018-07-23 (×3): qty 1

## 2018-07-23 MED ORDER — MECLIZINE HCL 25 MG PO TABS: 25.0000 mg | ORAL_TABLET | Freq: Four times a day (QID) | ORAL | Status: DC | PRN

## 2018-07-23 MED ORDER — LACTATED RINGERS IV SOLN
INTRAVENOUS | Status: DC
  Administered 2018-07-23 (×2): via INTRAVENOUS

## 2018-07-23 MED ORDER — ASPIRIN 81 MG PO CHEW
81.0000 mg | CHEWABLE_TABLET | Freq: Two times a day (BID) | ORAL | Status: DC
  Administered 2018-07-23 – 2018-07-25 (×4): 81 mg via ORAL
  Filled 2018-07-23 (×4): qty 1

## 2018-07-23 MED ORDER — METOCLOPRAMIDE HCL 5 MG PO TABS: 5.0000 mg | ORAL_TABLET | Freq: Three times a day (TID) | ORAL | Status: DC | PRN

## 2018-07-23 MED ORDER — HYDROCODONE-ACETAMINOPHEN 5-325 MG PO TABS
1.0000 | ORAL_TABLET | ORAL | Status: DC | PRN
  Administered 2018-07-23: 1 via ORAL
  Administered 2018-07-23: 2 via ORAL
  Administered 2018-07-24 (×2): 1 via ORAL
  Administered 2018-07-24: 2 via ORAL
  Administered 2018-07-24 – 2018-07-25 (×4): 1 via ORAL
  Filled 2018-07-23: qty 2
  Filled 2018-07-23 (×2): qty 1
  Filled 2018-07-23: qty 2
  Filled 2018-07-23 (×5): qty 1

## 2018-07-23 MED ORDER — FENTANYL CITRATE (PF) 100 MCG/2ML IJ SOLN: 25.0000 ug | INTRAMUSCULAR | Status: DC | PRN

## 2018-07-23 MED ORDER — OXYCODONE HCL 5 MG PO TABS: 5.0000 mg | ORAL_TABLET | Freq: Once | ORAL | Status: DC | PRN

## 2018-07-23 MED ORDER — TRAZODONE HCL 50 MG PO TABS
50.0000 mg | ORAL_TABLET | Freq: Every day | ORAL | Status: DC
  Administered 2018-07-23 – 2018-07-24 (×2): 50 mg via ORAL
  Filled 2018-07-23 (×2): qty 1

## 2018-07-23 MED ORDER — ONDANSETRON HCL 4 MG/2ML IJ SOLN: 4.0000 mg | Freq: Once | INTRAMUSCULAR | Status: DC | PRN

## 2018-07-23 MED ORDER — PRAVASTATIN SODIUM 20 MG PO TABS
40.0000 mg | ORAL_TABLET | Freq: Every morning | ORAL | Status: DC
  Administered 2018-07-23 – 2018-07-25 (×3): 40 mg via ORAL
  Filled 2018-07-23 (×3): qty 2

## 2018-07-23 MED ORDER — PROPOFOL 10 MG/ML IV BOLUS
INTRAVENOUS | Status: AC
  Filled 2018-07-23: qty 20

## 2018-07-23 MED ORDER — VANCOMYCIN HCL IN DEXTROSE 1-5 GM/200ML-% IV SOLN
1000.0000 mg | INTRAVENOUS | Status: AC
  Administered 2018-07-23: 1000 mg via INTRAVENOUS
  Filled 2018-07-23: qty 200

## 2018-07-23 MED ORDER — FENTANYL CITRATE (PF) 100 MCG/2ML IJ SOLN
INTRAMUSCULAR | Status: AC
  Administered 2018-07-23: 50 ug
  Filled 2018-07-23: qty 2

## 2018-07-23 MED ORDER — SODIUM CHLORIDE 0.9 % IJ SOLN
INTRAMUSCULAR | Status: DC | PRN
  Administered 2018-07-23: 30 mL

## 2018-07-23 MED ORDER — METHOCARBAMOL 500 MG IVPB - SIMPLE MED
500.0000 mg | Freq: Four times a day (QID) | INTRAVENOUS | Status: DC | PRN
  Administered 2018-07-23: 500 mg via INTRAVENOUS
  Filled 2018-07-23: qty 50

## 2018-07-23 MED ORDER — ONDANSETRON HCL 4 MG PO TABS: 4.0000 mg | ORAL_TABLET | Freq: Four times a day (QID) | ORAL | Status: DC | PRN

## 2018-07-23 MED ORDER — DEXAMETHASONE SODIUM PHOSPHATE 10 MG/ML IJ SOLN
10.0000 mg | Freq: Once | INTRAMUSCULAR | Status: AC
  Administered 2018-07-23: 10 mg via INTRAVENOUS

## 2018-07-23 MED ORDER — BUPIVACAINE HCL (PF) 0.25 % IJ SOLN
INTRAMUSCULAR | Status: DC | PRN
  Administered 2018-07-23: 30 mL

## 2018-07-23 MED ORDER — KETOROLAC TROMETHAMINE 30 MG/ML IJ SOLN
INTRAMUSCULAR | Status: DC | PRN
  Administered 2018-07-23: 30 mg

## 2018-07-23 MED ORDER — DIPHENHYDRAMINE HCL 12.5 MG/5ML PO ELIX: 12.5000 mg | ORAL_SOLUTION | ORAL | Status: DC | PRN

## 2018-07-23 MED ORDER — OXYCODONE HCL 5 MG/5ML PO SOLN
5.0000 mg | Freq: Once | ORAL | Status: DC | PRN
  Filled 2018-07-23: qty 5

## 2018-07-23 MED ORDER — BUPROPION HCL ER (SR) 150 MG PO TB12
150.0000 mg | ORAL_TABLET | Freq: Every morning | ORAL | Status: DC
  Administered 2018-07-24 – 2018-07-25 (×2): 150 mg via ORAL
  Filled 2018-07-23 (×2): qty 1

## 2018-07-23 MED ORDER — MAGNESIUM CITRATE PO SOLN: 1.0000 | Freq: Once | ORAL | Status: DC | PRN

## 2018-07-23 MED ORDER — ONDANSETRON HCL 4 MG/2ML IJ SOLN
4.0000 mg | Freq: Four times a day (QID) | INTRAMUSCULAR | Status: DC | PRN
  Administered 2018-07-23: 4 mg via INTRAVENOUS
  Filled 2018-07-23: qty 2

## 2018-07-23 MED ORDER — BENZONATATE 100 MG PO CAPS: 100.0000 mg | ORAL_CAPSULE | Freq: Three times a day (TID) | ORAL | Status: DC | PRN

## 2018-07-23 MED ORDER — VALACYCLOVIR HCL 500 MG PO TABS
500.0000 mg | ORAL_TABLET | Freq: Two times a day (BID) | ORAL | Status: DC | PRN
  Filled 2018-07-23: qty 1

## 2018-07-23 MED ORDER — 0.9 % SODIUM CHLORIDE (POUR BTL) OPTIME
TOPICAL | Status: DC | PRN
  Administered 2018-07-23: 1000 mL

## 2018-07-23 MED ORDER — ALUM & MAG HYDROXIDE-SIMETH 200-200-20 MG/5ML PO SUSP: 15.0000 mL | ORAL | Status: DC | PRN

## 2018-07-23 MED ORDER — METHOCARBAMOL 500 MG IVPB - SIMPLE MED
INTRAVENOUS | Status: AC
  Administered 2018-07-23: 500 mg via INTRAVENOUS
  Filled 2018-07-23: qty 50

## 2018-07-23 MED ORDER — TRANEXAMIC ACID 1000 MG/10ML IV SOLN
1000.0000 mg | Freq: Once | INTRAVENOUS | Status: AC
  Administered 2018-07-23: 1000 mg via INTRAVENOUS
  Filled 2018-07-23: qty 1000

## 2018-07-23 MED ORDER — DEXAMETHASONE SODIUM PHOSPHATE 10 MG/ML IJ SOLN
INTRAMUSCULAR | Status: AC
  Filled 2018-07-23: qty 1

## 2018-07-23 MED ORDER — DEXAMETHASONE SODIUM PHOSPHATE 10 MG/ML IJ SOLN
10.0000 mg | Freq: Once | INTRAMUSCULAR | Status: AC
  Administered 2018-07-24: 10 mg via INTRAVENOUS
  Filled 2018-07-23: qty 1

## 2018-07-23 MED ORDER — BUPIVACAINE HCL (PF) 0.25 % IJ SOLN
INTRAMUSCULAR | Status: AC
  Filled 2018-07-23: qty 30

## 2018-07-23 MED ORDER — TRANEXAMIC ACID 1000 MG/10ML IV SOLN
1000.0000 mg | INTRAVENOUS | Status: AC
  Administered 2018-07-23: 1000 mg via INTRAVENOUS
  Filled 2018-07-23: qty 10

## 2018-07-23 MED ORDER — BUPIVACAINE IN DEXTROSE 0.75-8.25 % IT SOLN
INTRATHECAL | Status: DC | PRN
  Administered 2018-07-23: 1.8 mL via INTRATHECAL

## 2018-07-23 MED ORDER — ONDANSETRON HCL 4 MG/2ML IJ SOLN
INTRAMUSCULAR | Status: DC | PRN
  Administered 2018-07-23: 4 mg via INTRAVENOUS

## 2018-07-23 MED ORDER — PROPOFOL 10 MG/ML IV BOLUS
INTRAVENOUS | Status: AC
  Filled 2018-07-23: qty 40

## 2018-07-23 MED ORDER — CHLORHEXIDINE GLUCONATE 4 % EX LIQD: 60.0000 mL | Freq: Once | CUTANEOUS | Status: DC

## 2018-07-23 MED ORDER — DULOXETINE HCL 30 MG PO CPEP
30.0000 mg | ORAL_CAPSULE | Freq: Every day | ORAL | Status: DC
  Administered 2018-07-24 – 2018-07-25 (×2): 30 mg via ORAL
  Filled 2018-07-23 (×2): qty 1

## 2018-07-23 SURGICAL SUPPLY — 44 items
BAG ZIPLOCK 12X15 (MISCELLANEOUS) ×2 IMPLANT
BLADE SAW SGTL 11.0X1.19X90.0M (BLADE) IMPLANT
BNDG COHESIVE 6X5 TAN NS LF (GAUZE/BANDAGES/DRESSINGS) ×2 IMPLANT
CLOTH BEACON ORANGE TIMEOUT ST (SAFETY) ×2 IMPLANT
COVER SURGICAL LIGHT HANDLE (MISCELLANEOUS) ×2 IMPLANT
CUFF TOURN SGL QUICK 34 (TOURNIQUET CUFF) ×1
CUFF TRNQT CYL 34X4X40X1 (TOURNIQUET CUFF) ×1 IMPLANT
DECANTER SPIKE VIAL GLASS SM (MISCELLANEOUS) ×2 IMPLANT
DERMABOND ADVANCED (GAUZE/BANDAGES/DRESSINGS) ×1
DERMABOND ADVANCED .7 DNX12 (GAUZE/BANDAGES/DRESSINGS) ×1 IMPLANT
DRAPE U-SHAPE 47X51 STRL (DRAPES) ×2 IMPLANT
DRSG AQUACEL AG ADV 3.5X10 (GAUZE/BANDAGES/DRESSINGS) ×2 IMPLANT
DURAPREP 26ML APPLICATOR (WOUND CARE) ×2 IMPLANT
ELECT REM PT RETURN 15FT ADLT (MISCELLANEOUS) ×2 IMPLANT
GLOVE BIOGEL M 7.0 STRL (GLOVE) IMPLANT
GLOVE BIOGEL PI IND STRL 7.5 (GLOVE) ×1 IMPLANT
GLOVE BIOGEL PI IND STRL 8.5 (GLOVE) ×1 IMPLANT
GLOVE BIOGEL PI INDICATOR 7.5 (GLOVE) ×1
GLOVE BIOGEL PI INDICATOR 8.5 (GLOVE) ×1
GLOVE ECLIPSE 8.0 STRL XLNG CF (GLOVE) IMPLANT
GLOVE ORTHO TXT STRL SZ7.5 (GLOVE) ×4 IMPLANT
GLOVE SURG ORTHO 8.0 STRL STRW (GLOVE) ×2 IMPLANT
GOWN STRL REUS W/TWL LRG LVL3 (GOWN DISPOSABLE) ×2 IMPLANT
GOWN STRL REUS W/TWL XL LVL3 (GOWN DISPOSABLE) ×4 IMPLANT
GRASPER SUT TROCAR 14GX15 (MISCELLANEOUS) ×2 IMPLANT
MANIFOLD NEPTUNE II (INSTRUMENTS) ×2 IMPLANT
NS IRRIG 1000ML POUR BTL (IV SOLUTION) ×2 IMPLANT
PACK TOTAL KNEE CUSTOM (KITS) ×2 IMPLANT
PASSER SUT SWANSON 36MM LOOP (INSTRUMENTS) ×2 IMPLANT
POSITIONER SURGICAL ARM (MISCELLANEOUS) ×2 IMPLANT
STAPLER VISISTAT (STAPLE) ×2 IMPLANT
SUT ETHIBOND NAB CT1 #1 30IN (SUTURE) ×2 IMPLANT
SUT FIBERWIRE #2 38 T-5 BLUE (SUTURE) ×2
SUT NYLON 3 0 (SUTURE) ×2 IMPLANT
SUT VIC AB 0 CT1 27 (SUTURE) ×1
SUT VIC AB 0 CT1 27XBRD ANTBC (SUTURE) ×1 IMPLANT
SUT VIC AB 1 CT1 27 (SUTURE) ×1
SUT VIC AB 1 CT1 27XBRD ANTBC (SUTURE) ×1 IMPLANT
SUT VIC AB 1 CT1 36 (SUTURE) ×2 IMPLANT
SUT VIC AB 2-0 CT1 27 (SUTURE) ×1
SUT VIC AB 2-0 CT1 TAPERPNT 27 (SUTURE) ×1 IMPLANT
SUTURE FIBERWR #2 38 T-5 BLUE (SUTURE) ×1 IMPLANT
WATER STERILE IRR 1000ML POUR (IV SOLUTION) ×2 IMPLANT
WRAP KNEE MAXI GEL POST OP (GAUZE/BANDAGES/DRESSINGS) ×2 IMPLANT

## 2018-07-23 NOTE — Op Note (Signed)
NAME: Nicole Stephenson, POLYAK MEDICAL RECORD ZO:[id number] ACCOUNT [id number] DATE OF BIRTH:03-21-1946 FACILITY: [facility] LOCATION: [facility] PHYSICIAN:Chella Chapdelaine Rosalia Hammers, MD  OPERATIVE REPORT  DATE OF PROCEDURE:  07/23/2018  PREOPERATIVE DIAGNOSIS:  Right patella avulsion, status post total knee arthroplasty.  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  Please see body of operative note and findings for full details and description.    PROCEDURE:  Patellar tendon repair with restoration of patellar height.  SURGEON:  Durene Romans, MD  ASSISTANT:  Lanney Gins PA-C.  Note that Mr. Carmon Sails was present for the entirety of the case and preoperative positioning, perioperative management of the operative extremity, general facilitation of the case and primary wound closure.  ANESTHESIA:  Regional, femoral nerve block plus spinal block anesthetic.  DRAINS:  None.  COMPLICATIONS:  None apparent.  ESTIMATED BLOOD LOSS:  Minimal.  TOURNIQUET TIME:  30 minutes at 250 mmHg.  INDICATIONS FOR PROCEDURE:  The patient is a 72 year old patient of mine with a history of right total knee arthroplasty just about 4 weeks ago.  She presented to the office after a fall.  She had on exam extensor lag and radiographic findings of the  patella avulsion.  Based on these findings, it was recommended that she have surgical repair of her patella and patellar tendon.  Risks of persistent extensor lag and weakness with gait were discussed.  Risks of infection given the operative time frame  from her index procedure.  Risks of DVT were all discussed and the postoperative course including being in full extension for 4-6 weeks and limited flexion capabilities postoperatively reviewed.  Consent was obtained for management of her extensor  mechanism injury.  DESCRIPTION OF PROCEDURE:  The patient was brought to the operative theater.  Once adequate  anesthesia, preoperative antibiotics, Ancef administered.  She was positioned supine.  A right thigh tourniquet was placed.  The right lower extremity was then  prepped and draped in sterile fashion.  Timeout was performed identifying the patient, the planned procedure and extremity.  Her old incision was utilized and extended proximally for exposure.  Soft tissue planes created.  It was immediately evident as  we entered the knee that the extensor mechanism from the inferior pole of the patella was intact.  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  I made medial and lateral arthrotomies for exposure evacuating old hematoma from her injury as well as  perhaps old hematoma from her knee replacement procedure.  I was able to palpate the inferior pole of the patella fragment within the posterior surface of the tendon and I excised this.  What I felt was in continuation of her tendon in the proximal third  of the patellar tendon.  At this point, I made a decision that she definitely had a patella alta and the exam findings in the clinic with extensor lag.  I held the patella in an anatomic position with the inferior pole of the patella within the box of  the femoral component.  I then placed a drill pin above the proximal pole of the patella into the distal femur to hold it in place.  At this point, I elected to excise the portion of attenuated nonviable appearing tendon which was about a centimeter 1.5  below the inferior pole of the patella.  I then used #2 FiberWire suture and reapproximated the 2 ends of the patella.  I did this with interrupted sutures and then did a #1 Vicryl  running suture over top of this.  I then used #2 FiberWire to  reapproximate the medial and lateral retinacular tissues to this repaired tendon to further support and maintain its orientation.  The pin was removed and the patella remained in its more anatomic position at this point.  The remainder of the retinacular  incisions  were then closed with #1 Vicryl in an interrupted fashion. Prior to final closure, we irrigated the wound out indicating a watertight seal.  Following final closure, the knee was injected with 0.25% Marcaine with 1 mL of Toradol and saline.   The remainder of the wound was closed in layers with 2-0 Vicryl and a running Monocryl stitch.  The knee wound was clean, dry and dressed sterilely using surgical glue and Aquacel dressing.  She was then brought to the recovery room in a knee immobilizer.  A TROM brace will be ordered to be locked in extension.  She will be allowed to be weightbearing as tolerated.  She can work on straight leg exercises with a knee brace on.  She will  remain with the recommended full knee extension for at least 4 weeks if not a total of 6 weeks before we begin working on motion to try to allow for as much healing as possible.  At this point, based on her prior total knee arthroplasty, healing will  have to come from the medial and lateral retinacular blood supply to the tendon tissues.  Findings were reviewed with her daughter.  She will be in the hospital overnight for observation and antibiotics prior to discharge.  TN/NUANCE  D:07/23/2018 T:07/23/2018 JOB:[id number]

## 2018-07-23 NOTE — Evaluation (Signed)
Physical Therapy Evaluation Patient Details Name: Nicole Stephenson MRN: [id number] DOB: 1945/11/11 Today's Date: 07/23/2018   History of Present Illness  72 yo female s/p R patellar tendon repair with restoration of patellar height. R TKR on 06/04/18. PMH of L reverse shoulder arthroplasty + revision after dislocation, obesity, anxiety, OA, CKDIII, DVT, dyspnea, HTN.   Clinical Impression   Pt presents with R knee pain, difficulty performing all mobility tasks, and decreased tolerance for activity. Pt in R knee immobilizer for duration of session. Pt lacking full knee extension in resting position, and had the tendency to slightly flex at the knee with WB even with KI donned. Verbal cuing from PT to keep knee in extension, pt informed of knee extension requirements repeatedly. PT to continue to follow acutely and will progress mobility as able.   Note: PT donned R knee immobilizer and pt informed that it should be on at all times, as pt's orders state KI should be worn at all times. Pt's TROM not in the room, so if it has been ordered, it has not arrived.     Follow Up Recommendations Follow surgeon's recommendation for DC plan and follow-up therapies(OPPT)    Equipment Recommendations  None recommended by PT    Recommendations for Other Services       Precautions / Restrictions Precautions Precautions: Fall;Shoulder Type of Shoulder Precautions: limited ROM. Do not pull on L shoulder Required Braces or Orthoses: Knee Immobilizer - Right Knee Immobilizer - Right: On at all times Restrictions Weight Bearing Restrictions: No Other Position/Activity Restrictions: WBAT      Mobility  Bed Mobility Overal bed mobility: Needs Assistance Bed Mobility: Supine to Sit     Supine to sit: Min assist;HOB elevated     General bed mobility comments: Min assist for trunk elevation and RLE management. PT ensuring pt's R knee in extension while lowering foot to ground.   Transfers Overall  transfer level: Needs assistance Equipment used: Rolling walker (2 wheeled) Transfers: Sit to/from [employer] Sit to Stand: Min assist         General transfer comment: Min assist for power up and steadying upon standing. Verbal cuing for hand placement, maintaining knee in knee extension (even though knee immobilizer donned). Pt stood and pivoted to recliner, requiring pt take about 3 steps with RW. Min assist for steadying. Pt with one period of mild knee buckle, pt-corrected, even with knee immobilizer on and tightly applied. Verbal reinforcement of maintaining knee in extension.   Ambulation/Gait                Stairs            Wheelchair Mobility    Modified Rankin (Stroke Patients Only)       Balance Overall balance assessment: Mild deficits observed, not formally tested                                           Pertinent Vitals/Pain Pain Assessment: 0-10 Pain Score: 2  Pain Location: R knee Pain Descriptors / Indicators: Throbbing Pain Intervention(s): Repositioned;Limited activity within patient's tolerance;Monitored during session;Ice applied    Home Living Family/patient expects to be discharged to:: Private residence Living Arrangements: Other relatives(lives with pt's sister, grandson) Available Help at Discharge: Family;Available 24 hours/day(for the first few days, daughter will stay with pt at night ) Type of Home: House Home Access:  Stairs to enter Entrance Stairs-Rails: Right Entrance Stairs-Number of Steps: 5 Home Layout: One level Home Equipment: Tub bench;Walker - 2 wheels;Cane - single point;Wheelchair - manual;Bedside commode      Prior Function Level of Independence: Independent with assistive device(s)         Comments: using walker to ambulate      Hand Dominance   Dominant Hand: Right    Extremity/Trunk Assessment   Upper Extremity Assessment Upper Extremity Assessment: LUE  deficits/detail LUE Deficits / Details: s/p reverse shoulder + revision 2018, pt states she has a clavicular fracture that has not healed     Lower Extremity Assessment Lower Extremity Assessment: Overall WFL for tasks assessed;RLE deficits/detail RLE Deficits / Details: suspected post-surgical weakness; able to perform assisted SLR with knee immobilizer donned  RLE Sensation: WNL    Cervical / Trunk Assessment Cervical / Trunk Assessment: Normal  Communication   Communication: No difficulties  Cognition Arousal/Alertness: Awake/alert Behavior During Therapy: WFL for tasks assessed/performed Overall Cognitive Status: Within Functional Limits for tasks assessed                                        General Comments      Exercises Total Joint Exercises Ankle Circles/Pumps: AROM;Both;10 reps;Seated   Assessment/Plan    PT Assessment Patient needs continued PT services  PT Problem List Decreased strength;Pain;Decreased range of motion;Decreased knowledge of use of DME;Decreased activity tolerance;Decreased balance;Decreased mobility       PT Treatment Interventions DME instruction;Therapeutic activities;Gait training;Therapeutic exercise;Patient/family education;Stair training;Balance training;Functional mobility training    PT Goals (Current goals can be found in the Care Plan section)  Acute Rehab PT Goals Patient Stated Goal: none stated  PT Goal Formulation: With patient Time For Goal Achievement: 08/06/18 Potential to Achieve Goals: Good    Frequency Min 6X/week   Barriers to discharge        Co-evaluation               AM-PAC PT "6 Clicks" Daily Activity  Outcome Measure Difficulty turning over in bed (including adjusting bedclothes, sheets and blankets)?: Unable Difficulty moving from lying on back to sitting on the side of the bed? : Unable Difficulty sitting down on and standing up from a chair with arms (e.g., wheelchair, bedside  commode, etc,.)?: Unable Help needed moving to and from a bed to chair (including a wheelchair)?: A Little Help needed walking in hospital room?: A Little Help needed climbing 3-5 steps with a railing? : A Little 6 Click Score: 12    End of Session Equipment Utilized During Treatment: Gait belt;Right knee immobilizer Activity Tolerance: Patient tolerated treatment well;Patient limited by pain Patient left: in chair;with chair alarm set;with call bell/phone within reach;with SCD's reapplied Nurse Communication: Mobility status PT Visit Diagnosis: Unsteadiness on feet (R26.81);Other abnormalities of gait and mobility (R26.89)    Time: 4098-1191 PT Time Calculation (min) (ACUTE ONLY): 31 min   Charges:   PT Evaluation $PT Eval Low Complexity: 1 Low PT Treatments $Therapeutic Activity: 8-22 mins        Terrisha Lopata Terrial Rhodes, PT Acute Rehabilitation Services Pager [phone]  Office [phone]   Korene Dula D Despina Hidden 07/23/2018, 6:51 PM

## 2018-07-23 NOTE — H&P (Signed)
  Status post right TKR within 4 weeks complicated by fall resulting in patella avulsion fracture.  Extensor lag with active extension attempt Pain  Palpable defect right patellar region  Full H&P to follow  NPO Plan to go to OR today for ORIF right patella Consent signed

## 2018-07-23 NOTE — Brief Op Note (Signed)
07/23/2018  9:50 AM  PATIENT:  Nicole Stephenson  72 y.o. female  PRE-OPERATIVE DIAGNOSIS:  Right patella fracture  POST-OPERATIVE DIAGNOSIS:  Right patella tendon tear, attentuation  PROCEDURE:  Procedure(s) with comments: Patella tendon repair (Right) - 90 mins  See op note  SURGEON:  Surgeon(s) and Role:    Durene Romans, MD - Primary  PHYSICIAN ASSISTANT: Lanney Gins, PA-C  ANESTHESIA:   regional and spinal  EBL:  20 mL   BLOOD ADMINISTERED:none  DRAINS: none   LOCAL MEDICATIONS USED:  MARCAINE     SPECIMEN:  No Specimen  DISPOSITION OF SPECIMEN:  N/A  COUNTS:  YES  TOURNIQUET:   Total Tourniquet Time Documented: Thigh (Right) - 31 minutes Total: Thigh (Right) - 31 minutes   DICTATION: .Other Dictation: Dictation Number [id number]  PLAN OF CARE: Admit for overnight observation  PATIENT DISPOSITION:  PACU - hemodynamically stable.   Delay start of Pharmacological VTE agent (>24hrs) due to surgical blood loss or risk of bleeding: no

## 2018-07-23 NOTE — Anesthesia Procedure Notes (Signed)
Spinal  Patient location during procedure: OR Start time: 07/23/2018 8:59 AM End time: 07/23/2018 8:41 AM Staffing Anesthesiologist: Witman, Carolyn E, MD Resident/CRNA: Alday, Stephen R, CRNA Performed: resident/CRNA  Preanesthetic Checklist Completed: patient identified, site marked, surgical consent, pre-op evaluation, timeout performed, IV checked, risks and benefits discussed and monitors and equipment checked Spinal Block Patient position: sitting Prep: DuraPrep Patient monitoring: heart rate, cardiac monitor, continuous pulse ox and blood pressure Approach: midline Location: L3-4 Injection technique: single-shot Needle Needle gauge: 24 G Needle length: 9 cm Needle insertion depth: 7 cm Assessment Sensory level: T6 Additional Notes Timeout performed. SAB kit date checked. SAB without difficulty.     

## 2018-07-23 NOTE — Anesthesia Procedure Notes (Addendum)
Anesthesia Regional Block: Femoral nerve block   Pre-Anesthetic Checklist: ,, timeout performed, Correct Patient, Correct Site, Correct Laterality, Correct Procedure, Correct Position, site marked, Risks and benefits discussed,  Surgical consent,  Pre-op evaluation,  At surgeon's request and post-op pain management  Laterality: Right  Prep: chloraprep       Needles:  Injection technique: Single-shot  Needle Type: Echogenic Stimulator Needle     Needle Length: 9cm  Needle Gauge: 21     Additional Needles:   Procedures:,,,, ultrasound used (permanent image in chart),,,,  Narrative:  Start time: 07/23/2018 7:55 AM End time: 07/23/2018 8:01 AM Injection made incrementally with aspirations every 5 mL.  Performed by: Personally  Anesthesiologist: Lucretia Kern, MD  Additional Notes: Monitors applied. Injection made in 5cc increments. No resistance to injection. Good needle visualization. Patient tolerated procedure well.

## 2018-07-23 NOTE — Anesthesia Postprocedure Evaluation (Signed)
Anesthesia Post Note  Patient: Nicole Stephenson  Procedure(s) Performed: Patella tendon repair (Right Knee)     Patient location during evaluation: PACU Anesthesia Type: Spinal Level of consciousness: awake and alert Pain management: pain level controlled Vital Signs Assessment: post-procedure vital signs reviewed and stable Respiratory status: spontaneous breathing, nonlabored ventilation, respiratory function stable and patient connected to nasal cannula oxygen Cardiovascular status: blood pressure returned to baseline and stable Postop Assessment: no apparent nausea or vomiting Anesthetic complications: no    Last Vitals:  Vitals:   07/23/18 1200 07/23/18 1215  BP: 132/72   Pulse: 72 74  Resp: 15 14  Temp:    SpO2: 100% 100%    Last Pain:  Vitals:   07/23/18 1130  PainSc: 0-No pain                 Lucretia Kern

## 2018-07-23 NOTE — H&P (Signed)
MACARIA BIAS is an 72 y.o. female.    Chief Complaint: Right patella avulsion fracture, s/p TKA  Procedure:  ORIF right patella avulsion fracture  HPI: Pt is a 72 y.o. female complaining of right knee pain for a couple of days. Patient was getting up and felt a pop in her knee.  Since that time she hasn't been able to fully extend her leg. Obvious defect noted in the area of the patella/patella tendon.   X-rays in the clinic show an avulsion fracture of the distal portion of the right patella.  Dr. Charlann Boxer saw the patient and explained the need for surgery and possible complication after having a TKA not to long ago.  Various options are discussed with the patient. Risks, benefits and expectations were discussed with the patient. Patient understand the risks, benefits and expectations and wishes to proceed with surgery.    PCP: Chilton Greathouse, MD  D/C Plans:       Home   Post-op Meds:       No Rx given   Tranexamic Acid:      To be given - IV   Decadron:      Is to be given  FYI:     Check last (CKD)  Norco  DME:   Pt already has equipment  PT:   No PT    PMH: Past Medical History:  Diagnosis Date  . Allergic rhinitis, seasonal   . Anemia   . Anxiety   . Arthritis   . Asthma    exercise induced  . Chronic kidney disease    Stage III  . Constipation   . Depression   . DJD (degenerative joint disease)   . DVT (deep venous thrombosis) (HCC) 1997  . Dyspnea    with exertion  . GERD (gastroesophageal reflux disease)   . 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 History of kidney stones    passed x 2  . Hypertension   . Pneumonia     PSH: Past Surgical History:  Procedure Laterality Date  . bottom of left foot for keloids - 1993    . COLONOSCOPY    . REVERSE SHOULDER ARTHROPLASTY Left 04/12/2017   Procedure: REVERSE SHOULDER ARTHROPLASTY;  Surgeon: Francena Hanly, MD;  Location: [facility] OR;  Service: Orthopedics;  Laterality: Left;  . REVISION TOTAL SHOULDER TO REVERSE TOTAL  SHOULDER Left 05/24/2017   Procedure: Revision left reverse shoulder arthroplasty;  Surgeon: Francena Hanly, MD;  Location: [facility] OR;  Service: Orthopedics;  Laterality: Left;  . right foot surgery ,rod inserted in lateral side of foot    . SHOULDER CLOSED REDUCTION Left 05/03/2017   Procedure: Closed reduction left shoulder ;  Surgeon: Francena Hanly, MD;  Location: [facility] OR;  Service: Orthopedics;  Laterality: Left;  . TOTAL KNEE ARTHROPLASTY Left 02/08/2016   Procedure: LEFT TOTAL KNEE ARTHROPLASTY;  Surgeon: Durene Romans, MD;  Location: [facility] ORS;  Service: Orthopedics;  Laterality: Left;  . TOTAL KNEE ARTHROPLASTY Right 06/04/2018   Procedure: RIGHT TOTAL KNEE ARTHROPLASTY;  Surgeon: Durene Romans, MD;  Location: [facility] ORS;  Service: Orthopedics;  Laterality: Right;  Adductor Block  . TUBAL LIGATION      Social History:  reports that she has never smoked. She has never used smokeless tobacco. She reports that she does not drink alcohol or use drugs.  Allergies:  Allergies  Allergen Reactions  . Valium [Diazepam] Other (See Comments)    Urinary incontinence, stumbling, falling Pt does  NOT want to have this medication again Can not take with Oxycodone  . Hydrocodone Other (See Comments)    Urinary incontinence, stumbling, falling  . Oxycodone Other (See Comments)    Urinary incontinence, stumbling, falling Can not take with Valium  . Penicillins Itching, Swelling and Other (See Comments)    > > > PATIENT HAS RECEIVED ANCEF MULTIPLE TIMES WITHOUT REACTION < < <     OK TO GIVE ANCEF, IF ORDERED, PER MD.  04/12/17 Arm swelling PATIENT HAD A PCN REACTION WITH IMMEDIATE RASH, FACIAL/TONGUE/THROAT SWELLING, SOB, OR LIGHTHEADEDNESS WITH HYPOTENSION:  #  #  #  YES  #  #  #   Has patient had a PCN reaction causing severe rash involving mucus membranes or skin necrosis: UNSPECIFIED   Has patient had a PCN reaction that required hospitalization No  . Lisinopril Cough    Causes Coughing     Medications: Current Facility-Administered Medications  Medication Dose Route Frequency Provider Last Rate Last Dose  . chlorhexidine (HIBICLENS) 4 % liquid 4 application  60 mL Topical Once Taris Galindo, PA-C      . dexamethasone (DECADRON) injection 10 mg  10 mg Intravenous Once Teonia Yager, PA-C      . gentamicin (GARAMYCIN) 340 mg in dextrose 5 % 100 mL IVPB  5 mg/kg (Adjusted) Intravenous On Call to OR Durene Romans, MD      . lactated ringers infusion   Intravenous Continuous Lucretia Kern, MD 20 mL/hr at 07/23/18 [phone]    . tranexamic acid (CYKLOKAPRON) 1,000 mg in sodium chloride 0.9 % 100 mL IVPB  1,000 mg Intravenous To OR Shadawn Hanaway, PA-C      . vancomycin (VANCOCIN) IVPB 1000 mg/200 mL premix  1,000 mg Intravenous On Call to OR Durene Romans, MD          Review of Systems  Constitutional: Negative.   HENT: Negative.   Eyes: Negative.   Respiratory: Negative.   Cardiovascular: Negative.   Gastrointestinal: Positive for heartburn.  Genitourinary: Negative.   Musculoskeletal: Positive for joint pain.  Skin: Negative.   Neurological: Negative.   Endo/Heme/Allergies: Positive for environmental allergies.  Psychiatric/Behavioral: Positive for depression. The patient is nervous/anxious.        Physical Exam  Constitutional: She is oriented to person, place, and time. She appears well-developed.  HENT:  Head: Normocephalic.  Eyes: Pupils are equal, round, and reactive to light.  Neck: Neck supple. No JVD present. No tracheal deviation present. No thyromegaly present.  Cardiovascular: Normal rate, regular rhythm and intact distal pulses.  Respiratory: Effort normal and breath sounds normal. No respiratory distress. She has no wheezes.  GI: Soft. There is no tenderness. There is no guarding.  Musculoskeletal:       Right knee: She exhibits decreased range of motion, swelling, effusion, deformity and bony tenderness. She exhibits no ecchymosis, no  laceration (healed previous incision) and no erythema. Tenderness found. Patellar tendon tenderness noted.  Lymphadenopathy:    She has no cervical adenopathy.  Neurological: She is alert and oriented to person, place, and time.  Skin: Skin is warm and dry.  Psychiatric: She has a normal mood and affect.       Assessment/Plan Assessment:  Right patella avulsion fracture, s/p TKA  Plan: Patient will undergo an ORIF of the right patella on 07/23/2018 per Dr. Charlann Boxer at [facility]. Risks benefits and expectations were discussed with the patient. Patient understand risks, benefits and expectations and wishes to proceed.  Anastasio Auerbach Mattias Walmsley   PA-C  07/23/2018, 7:35 AM

## 2018-07-23 NOTE — Transfer of Care (Signed)
Immediate Anesthesia Transfer of Care Note  Patient: Nicole Stephenson  Procedure(s) Performed: Patella tendon repair (Right Knee)  Patient Location: PACU  Anesthesia Type:Regional and Spinal  Level of Consciousness: sedated  Airway & Oxygen Therapy: Patient Spontanous Breathing and Patient connected to face mask oxygen  Post-op Assessment: Report given to RN and Post -op Vital signs reviewed and stable  Post vital signs: Reviewed and stable  Last Vitals:  Vitals Value Taken Time  BP    Temp    Pulse 75 07/23/2018 10:14 AM  Resp 17 07/23/2018 10:14 AM  SpO2 100 % 07/23/2018 10:14 AM  Vitals shown include unvalidated device data.  Last Pain: There were no vitals filed for this visit.       Complications: No apparent anesthesia complications

## 2018-07-23 NOTE — Progress Notes (Signed)
AssistedDr. Clemens Catholic with right, femoral\ block. Side rails up, monitors on throughout procedure. See vital signs in flow sheet. Tolerated Procedure well.

## 2018-07-24 ENCOUNTER — Encounter (HOSPITAL_COMMUNITY): Payer: Self-pay | Admitting: Orthopedic Surgery

## 2018-07-24 LAB — BASIC METABOLIC PANEL
Anion gap: 9 (ref 5–15)
BUN: 24 mg/dL — AB (ref 8–23)
CHLORIDE: 98 mmol/L (ref 98–111)
CO2: 25 mmol/L (ref 22–32)
CREATININE: 1.25 mg/dL — AB (ref 0.44–1.00)
Calcium: 9.1 mg/dL (ref 8.9–10.3)
GFR calc non Af Amer: 42 mL/min — ABNORMAL LOW (ref >60)
GFR, EST AFRICAN AMERICAN: 49 mL/min — AB (ref >60)
Glucose, Bld: 118 mg/dL — ABNORMAL HIGH (ref 70–99)
Potassium: 4.6 mmol/L (ref 3.5–5.1)
Sodium: 132 mmol/L — ABNORMAL LOW (ref 135–145)

## 2018-07-24 LAB — CBC
HEMATOCRIT: 28.3 % — AB (ref 36.0–46.0)
HEMOGLOBIN: 8.7 g/dL — AB (ref 12.0–15.0)
MCH: 29.3 pg (ref 26.0–34.0)
MCHC: 30.7 g/dL (ref 30.0–36.0)
MCV: 95.3 fL (ref 80.0–100.0)
Platelets: 264 10*3/uL (ref 150–400)
RBC: 2.97 MIL/uL — ABNORMAL LOW (ref 3.87–5.11)
RDW: 13.4 % (ref 11.5–15.5)
WBC: 6.5 10*3/uL (ref 4.0–10.5)
nRBC: 0 % (ref 0.0–0.2)

## 2018-07-24 MED ORDER — HYDROCODONE-ACETAMINOPHEN 7.5-325 MG PO TABS: 1.0000 | ORAL_TABLET | ORAL | 0 refills | Status: AC | PRN

## 2018-07-24 MED ORDER — METHOCARBAMOL 500 MG PO TABS: 500.0000 mg | ORAL_TABLET | Freq: Four times a day (QID) | ORAL | 0 refills | Status: AC | PRN

## 2018-07-24 MED ORDER — ASPIRIN 81 MG PO CHEW: 81.0000 mg | CHEWABLE_TABLET | Freq: Two times a day (BID) | ORAL | 0 refills | Status: AC

## 2018-07-24 NOTE — Progress Notes (Signed)
Physical Therapy Treatment Patient Details Name: Nicole Stephenson MRN: [id number] DOB: 1946-06-11 Today's Date: 07/24/2018    History of Present Illness 72 yo female s/p R patellar tendon repair with restoration of patellar height. R TKR on 06/04/18. PMH of L reverse shoulder arthroplasty + revision after dislocation, obesity, anxiety, OA, CKDIII, DVT, dyspnea, HTN.     PT Comments    The Bledsoe brace has not arrived. Right KI repositioned and snugged. Patient able to ambulate  X 30' WITHOUT SIGNS OF BUCKLING. Continue PT   Follow Up Recommendations  Follow surgeon's recommendation for DC plan and follow-up therapies     Equipment Recommendations  None recommended by PT    Recommendations for Other Services       Precautions / Restrictions Precautions Precautions: Fall;Shoulder Type of Shoulder Precautions: limited ROM. Do not pull on L shoulder Required Braces or Orthoses: Knee Immobilizer - Right;Other Brace/Splint Knee Immobilizer - Right: On at all times , no right knee flexion Other Brace/Splint: Bledsoe has been ordered, not delivered yet Restrictions Other Position/Activity Restrictions: WBAT    Mobility  Bed Mobility   Bed Mobility: Supine to Sit     Supine to sit: Supervision     General bed mobility comments: patient self manages the right leg  Transfers Overall transfer level: Needs assistance Equipment used: Rolling walker (2 wheeled) Transfers: Sit to/from Stand Sit to Stand: Min guard         General transfer comment: Right  quads and leg are much stronger and "awake" and able to safely stand after KI  snugged and repositioned  Ambulation/Gait Ambulation/Gait assistance: Min guard Gait Distance (Feet): 30 Feet Assistive device: Rolling walker (2 wheeled) Gait Pattern/deviations: Step-to pattern     General Gait Details: VCs safety, technique, sequence. Patient reports no buckling inside the KI this visit.   Stairs   Stairs assistance: Water quality scientist Rankin (Stroke Patients Only)       Balance                                            Cognition Arousal/Alertness: Awake/alert                                            Exercises      General Comments        Pertinent Vitals/Pain Pain Score: 2  Pain Location: R knee Pain Descriptors / Indicators: Throbbing Pain Intervention(s): Monitored during session;Premedicated before session;Ice applied    Home Living                      Prior Function            PT Goals (current goals can now be found in the care plan section) Progress towards PT goals: Progressing toward goals    Frequency    Min 6X/week      PT Plan Current plan remains appropriate    Co-evaluation              AM-PAC PT "6 Clicks" Daily Activity  Outcome Measure  Difficulty turning over in bed (including adjusting bedclothes, sheets and blankets)?: A Little Difficulty moving from lying on  back to sitting on the side of the bed? : A Little Difficulty sitting down on and standing up from a chair with arms (e.g., wheelchair, bedside commode, etc,.)?: A Little Help needed moving to and from a bed to chair (including a wheelchair)?: A Little Help needed walking in hospital room?: A Lot Help needed climbing 3-5 steps with a railing? : Total 6 Click Score: 15    End of Session Equipment Utilized During Treatment: Right knee immobilizer Activity Tolerance: Patient tolerated treatment well Patient left: in chair;with call bell/phone within reach;with family/visitor present Nurse Communication: Mobility status PT Visit Diagnosis: Unsteadiness on feet (R26.81);Other abnormalities of gait and mobility (R26.89) Pain - Right/Left: Right Pain - part of body: Knee     Time: 1335-1350 PT Time Calculation (min) (ACUTE ONLY): 15 min  Charges:  $Gait Training: 8-22 mins                     Blanchard Kelch PT Acute Rehabilitation Services Pager [phone] Office [phone]    Rada Hay 07/24/2018, 2:18 PM

## 2018-07-24 NOTE — Progress Notes (Signed)
     Subjective: 1 Day Post-Op Procedure(s) (LRB): Patella tendon repair (Right)   Patient reports pain as mild, pain controlled at the knee.  States that she is having some pain at the area of the tourniquet. We have discussed the changing from the KI to the Bledsoe brace. We will keep her today to see how she is working with PT.      Objective:   VITALS:   Vitals:   07/24/18 0143 07/24/18 0532  BP: 134/68 124/65  Pulse: 73 70  Resp: 16 16  Temp: 97.6 F (36.4 C) 97.6 F (36.4 C)  SpO2: 100% 100%    Dorsiflexion/Plantar flexion intact Incision: dressing C/D/I No cellulitis present Compartment soft  LABS Recent Labs    07/24/18 0456  HGB 8.7*  HCT 28.3*  WBC 6.5  PLT 264    Recent Labs    07/24/18 0456  NA 132*  K 4.6  BUN 24*  CREATININE 1.25*  GLUCOSE 118*     Assessment/Plan: 1 Day Post-Op Procedure(s) (LRB): Patella tendon repair (Right) Foley cath d/c'ed Advance diet Up with therapy D/C IV fluids Discharge home, possibly tomorrow Will see how she does working with PT Bledsoe brace ordered, to be locked in extension   Anastasio Auerbach. Nicole Stephenson   PAC  07/24/2018, 8:55 AM

## 2018-07-25 LAB — BASIC METABOLIC PANEL
Anion gap: 10 (ref 5–15)
BUN: 18 mg/dL (ref 8–23)
CHLORIDE: 101 mmol/L (ref 98–111)
CO2: 28 mmol/L (ref 22–32)
CREATININE: 1.16 mg/dL — AB (ref 0.44–1.00)
Calcium: 9.2 mg/dL (ref 8.9–10.3)
GFR calc Af Amer: 53 mL/min — ABNORMAL LOW (ref >60)
GFR calc non Af Amer: 46 mL/min — ABNORMAL LOW (ref >60)
GLUCOSE: 95 mg/dL (ref 70–99)
POTASSIUM: 3.5 mmol/L (ref 3.5–5.1)
Sodium: 139 mmol/L (ref 135–145)

## 2018-07-25 LAB — CBC
HCT: 30.6 % — ABNORMAL LOW (ref 36.0–46.0)
HEMOGLOBIN: 9.7 g/dL — AB (ref 12.0–15.0)
MCH: 29.9 pg (ref 26.0–34.0)
MCHC: 31.7 g/dL (ref 30.0–36.0)
MCV: 94.4 fL (ref 80.0–100.0)
PLATELETS: 261 10*3/uL (ref 150–400)
RBC: 3.24 MIL/uL — AB (ref 3.87–5.11)
RDW: 13.5 % (ref 11.5–15.5)
WBC: 6.1 10*3/uL (ref 4.0–10.5)
nRBC: 0 % (ref 0.0–0.2)

## 2018-07-25 NOTE — Progress Notes (Signed)
Patient ID: Nicole Stephenson, female   DOB: Aug 08, 1946, 72 y.o.   MRN: [id number] Subjective: 2 Days Post-Op Procedure(s) (LRB): Patella tendon repair (Right)    Patient reports pain as mild.  Getting ready to work with therapy.  Objective:   VITALS:   Vitals:   07/24/18 2133 07/25/18 0511  BP: (!) 156/75 (!) 174/79  Pulse: 76 70  Resp: 16 17  Temp: 98.1 F (36.7 C) 98.1 F (36.7 C)  SpO2: 100% 100%    Neurovascular intact Incision: dressing C/D/I  Removed ACE wrap and adjusted Bledsoe brace to appropriate position- tightened down to try and keep in appropriate position  LABS Recent Labs    07/24/18 0456 07/25/18 0517  HGB 8.7* 9.7*  HCT 28.3* 30.6*  WBC 6.5 6.1  PLT 264 261    Recent Labs    07/24/18 0456 07/25/18 0517  NA 132* 139  K 4.6 3.5  BUN 24* 18  CREATININE 1.25* 1.16*  GLUCOSE 118* 95    No results for input(s): LABPT, INR in the last 72 hours.   Assessment/Plan: 2 Days Post-Op Procedure(s) (LRB): Patella tendon repair (Right)   Up with therapy  Home today No formal therapy other than instructions from here Maintain full knee extension in brace Can perform SLR and quad sets in brace Carefull ADLs RTC in 2 weeks

## 2018-07-25 NOTE — Progress Notes (Signed)
Physical Therapy Treatment Patient Details Name: Nicole Stephenson MRN: [id number] DOB: 12-04-1945 Today's Date: 07/25/2018    History of Present Illness 72 yo female s/p R patellar tendon repair with restoration of patellar height. R TKR on 06/04/18. PMH of L reverse shoulder arthroplasty + revision after dislocation, obesity, anxiety, OA, CKDIII, DVT, dyspnea, HTN.     PT Comments    Dr. Charlann Boxer in and repositioned Bledsoe. Gave ok for SLR, . Reiterated brace position, and  When need to reposition if brace slides down. Will practice steps next visit after Medication.  Follow Up Recommendations  Follow surgeon's recommendation for DC plan and follow-up therapies;No PT follow up     Equipment Recommendations  None recommended by PT    Recommendations for Other Services       Precautions / Restrictions Precautions Precautions: Fall;Shoulder Type of Shoulder Precautions: no right knee flexion. Do not pull on L shoulder Precaution Comments: Bledsoe locked in extension at all times Restrictions Weight Bearing Restrictions: Yes RLE Weight Bearing: Weight bearing as tolerated    Mobility  Bed Mobility   Bed Mobility: Supine to Sit     Supine to sit: Supervision     General bed mobility comments: patient self manages the right leg, cues  to slide  to the bed edge so foot is not dangling which increases pain  Transfers Overall transfer level: Needs assistance Equipment used: Rolling walker (2 wheeled) Transfers: Sit to/from Stand Sit to Stand: Min guard         General transfer comment: from bed and BSC  Ambulation/Gait Ambulation/Gait assistance: Min guard Gait Distance (Feet): 20 Feet(x 2) Assistive device: Rolling walker (2 wheeled) Gait Pattern/deviations: Step-to pattern;Step-through pattern Gait velocity: decr   General Gait Details: VCs safety, technique, sequence. Bledsoe in place   Stairs             Wheelchair Mobility    Modified Rankin (Stroke  Patients Only)       Balance                                            Cognition Arousal/Alertness: Awake/alert                                            Exercises      General Comments        Pertinent Vitals/Pain Pain Assessment: 0-10 Pain Score: 6  Pain Location: R knee Pain Descriptors / Indicators: Throbbing Pain Intervention(s): Monitored during session;Repositioned;Ice applied    Home Living                      Prior Function            PT Goals (current goals can now be found in the care plan section) Progress towards PT goals: Progressing toward goals    Frequency    Min 6X/week      PT Plan Current plan remains appropriate    Co-evaluation              AM-PAC PT "6 Clicks" Daily Activity  Outcome Measure                   End of Session Equipment Utilized During Treatment: Other (comment)(Bledsoe)  Activity Tolerance: Patient tolerated treatment well Patient left: in chair;with call bell/phone within reach;with family/visitor present Nurse Communication: Mobility status PT Visit Diagnosis: Unsteadiness on feet (R26.81);Other abnormalities of gait and mobility (R26.89) Pain - Right/Left: Right Pain - part of body: Knee     Time: 0912-0940 PT Time Calculation (min) (ACUTE ONLY): 28 min  Charges:  $Gait Training: 8-22 mins $Self Care/Home Management: 8-22                     Blanchard Kelch PT Acute Rehabilitation Services Pager [phone] Office [phone]    Rada Hay 07/25/2018, 10:01 AM

## 2018-07-25 NOTE — Progress Notes (Signed)
+-Physical Therapy Treatment Patient Details Name: Nicole Stephenson MRN: [id number] DOB: December 17, 1945 Today's Date: 07/25/2018    History of Present Illness 72 yo female s/p R patellar tendon repair with restoration of patellar height. R TKR on 06/04/18. PMH of L reverse shoulder arthroplasty + revision after dislocation, obesity, anxiety, OA, CKDIII, DVT, dyspnea, HTN.     PT Comments    The patient practiced steps. Plans Dc today.   Follow Up Recommendations  Follow surgeon's recommendation for DC plan and follow-up therapies;No PT follow up     Equipment Recommendations  None recommended by PT    Recommendations for Other Services       Precautions / Restrictions Precautions Precautions: Fall;Shoulder Type of Shoulder Precautions: no right knee flexion. Do not pull on L shoulder Precaution Comments: Bledsoe locked in extension at all times    Mobility  Bed Mobility Overal bed mobility: Modified Independent Bed Mobility: Supine to Sit     Supine to sit: Supervision     General bed mobility comments: with leg lifter  Transfers Overall transfer level: Modified independent Equipment used: Rolling walker (2 wheeled) Transfers: Sit to/from Stand Sit to Stand: Min guard         General transfer comment: from bed and [facility]  Ambulation/Gait Ambulation/Gait assistance: Supervision Gait Distance (Feet): 20 Feet Assistive device: Rolling walker (2 wheeled) Gait Pattern/deviations: Step-to pattern;Step-through pattern Gait velocity: decr   General Gait Details: VCs safety, technique, sequence. Bledsoe in place   Stairs Stairs: Yes Stairs assistance: Min guard Stair Management: Sideways;One rail Left Number of Stairs: 2 General stair comments: performed well.   Wheelchair Mobility    Modified Rankin (Stroke Patients Only)       Balance                                            Cognition Arousal/Alertness: Awake/alert                                             Exercises Total Joint Exercises Ankle Circles/Pumps: AROM;Both;10 reps Quad Sets: AROM;5 reps;Right Straight Leg Raises: AAROM;Right;5 reps    General Comments        Pertinent Vitals/Pain Pain Assessment: 0-10 Pain Score: 3  Pain Location: R knee brace Pain Descriptors / Indicators: Tightness Pain Intervention(s): Monitored during session;Limited activity within patient's tolerance    Home Living                      Prior Function            PT Goals (current goals can now be found in the care plan section) Progress towards PT goals: Progressing toward goals    Frequency    Min 6X/week      PT Plan Current plan remains appropriate    Co-evaluation              AM-PAC PT "6 Clicks" Daily Activity  Outcome Measure  Difficulty turning over in bed (including adjusting bedclothes, sheets and blankets)?: A Little Difficulty moving from lying on back to sitting on the side of the bed? : A Little Difficulty sitting down on and standing up from a chair with arms (e.g., wheelchair, bedside commode, etc,.)?: A Little Help needed moving  to and from a bed to chair (including a wheelchair)?: A Little Help needed walking in hospital room?: A Little Help needed climbing 3-5 steps with a railing? : A Little 6 Click Score: 18    End of Session Equipment Utilized During Treatment: Other (comment)(Bledsoe) Activity Tolerance: Patient tolerated treatment well Patient left: in bed;with call bell/phone within reach Nurse Communication: Mobility status PT Visit Diagnosis: Unsteadiness on feet (R26.81);Other abnormalities of gait and mobility (R26.89) Pain - Right/Left: Right Pain - part of body: Knee     Time: 1610-9604 PT Time Calculation (min) (ACUTE ONLY): 23 min  Charges:  $Gait Training: 8-22 mins $Therapeutic Exercise: 8-22 mins                     Blanchard Kelch PT Acute Rehabilitation Services Pager  [phone] Office [phone]    Rada Hay 07/25/2018, 12:28 PM

## 2018-07-25 NOTE — Addendum Note (Signed)
Addendum  created 07/25/18 0739 by Lucretia Kern, MD   Intraprocedure Blocks edited, Sign clinical note

## 2018-07-29 NOTE — Discharge Summary (Signed)
Physician Discharge Summary  Patient ID: Nicole Stephenson MRN: [id number] DOB/AGE: 11/25/1945 72 y.o.  Admit date: 07/23/2018 Discharge date: 07/25/2018   Procedures:  Procedure(s) (LRB): Patella tendon repair (Right)  Attending Physician:  Dr. Durene Romans   Admission Diagnoses:   Right patella avulsion fracture, s/p TKA  Discharge Diagnoses:  Active Problems:   Right patella fracture  Past Medical History:  Diagnosis Date  . Allergic rhinitis, seasonal   . Anemia   . Anxiety   . Arthritis   . Asthma    exercise induced  . Chronic kidney disease    Stage III  . Constipation   . Depression   . DJD (degenerative joint disease)   . DVT (deep venous thrombosis) (HCC) 1997  . Dyspnea    with exertion  . GERD (gastroesophageal reflux disease)   . 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 History of kidney stones    passed x 2  . Hypertension   . Pneumonia     HPI:    Pt is a 72 y.o. female complaining of right knee pain for a couple of days. Patient was getting up and felt a pop in her knee.  Since that time she hasn't been able to fully extend her leg. Obvious defect noted in the area of the patella/patella tendon.   X-rays in the clinic show an avulsion fracture of the distal portion of the right patella.  Dr. Charlann Boxer saw the patient and explained the need for surgery and possible complication after having a TKA not to long ago.  Various options are discussed with the patient. Risks, benefits and expectations were discussed with the patient. Patient understand the risks, benefits and expectations and wishes to proceed with surgery.   PCP: Chilton Greathouse, MD   Discharged Condition: good  Hospital Course:  Patient underwent the above stated procedure on 07/23/2018. Patient tolerated the procedure well and brought to the recovery room in good condition and subsequently to the floor.  POD #1 BP: 124/65 ; Pulse: 70 ; Temp: 97.6 F (36.4 C) ; Resp: 16 Patient reports pain as mild, pain  controlled at the knee.  States that she is having some pain at the area of the tourniquet. We have discussed the changing from the KI to the Bledsoe brace. We will keep her today to see how she is working with PT.  Dorsiflexion/plantar flexion intact, incision: dressing C/D/I, no cellulitis present and compartment soft.   LABS  Basename    HGB     8.7  HCT     28.3   POD #2  BP: 174/79 ; Pulse: 70 ; Temp: 98.1 F (36.7 C) ; Resp: 17 Patient reports pain as mild.  Getting ready to work with therapy. Neurovascular intact. Incision: dressing C/D/I. Removed ACE wrap and adjusted Bledsoe brace to appropriate position- tightened down to try and keep in appropriate position  LABS  Basename    HGB     9.7  HCT     30.6    Discharge Exam: General appearance: alert, cooperative and no distress Extremities: Homans sign is negative, no sign of DVT, no edema, redness or tenderness in the calves or thighs and no ulcers, gangrene or trophic changes  Disposition:  Home with follow up in 2 weeks   Follow-up Information    Durene Romans, MD. Schedule an appointment as soon as possible for a visit in 2 weeks.   Specialty:  Orthopedic Surgery Contact information: [address]  [address] [place] [address] [phone]           Discharge Instructions    Call MD / Call 911   Complete by:  As directed    If you experience chest pain or shortness of breath, CALL 911 and be transported to the hospital emergency room.  If you develope a fever above 101 F, pus (white drainage) or increased drainage or redness at the wound, or calf pain, call your surgeon's office.   Constipation Prevention   Complete by:  As directed    Drink plenty of fluids.  Prune juice may be helpful.  You may use a stool softener, such as Colace (over the counter) 100 mg twice a day.  Use MiraLax (over the counter) for constipation as needed.   Diet - low sodium heart healthy   Complete by:  As directed     Discharge instructions   Complete by:  As directed    Maintain surgical dressing until follow up in the clinic. If the edges start to pull up, may reinforce with tape. If the dressing is no longer working, may remove and cover with gauze and tape, but must keep the area dry and clean.  Follow up in 2 weeks at [facility] Call with any questions or concerns.   Increase activity slowly as tolerated   Complete by:  As directed    Weight bearing as tolerated with assist device (walker, cane, etc) as directed, use it as long as suggested by your surgeon or therapist, typically at least 4-6 weeks.  Use knee immobilizer at all times except for bathing.  DO NOT BEND THE KNEE.      Allergies as of 07/25/2018      Reactions   Valium [diazepam] Other (See Comments)   Urinary incontinence, stumbling, falling Pt does NOT want to have this medication again Can not take with Oxycodone   Hydrocodone Other (See Comments)   Urinary incontinence, stumbling, falling   Oxycodone Other (See Comments)   Urinary incontinence, stumbling, falling Can not take with Valium   Penicillins Itching, Swelling, Other (See Comments)   > > > PATIENT HAS RECEIVED ANCEF MULTIPLE TIMES WITHOUT REACTION < < <     OK TO GIVE ANCEF, IF ORDERED, PER MD.  04/12/17 Arm swelling PATIENT HAD A PCN REACTION WITH IMMEDIATE RASH, FACIAL/TONGUE/THROAT SWELLING, SOB, OR LIGHTHEADEDNESS WITH HYPOTENSION:  #  #  #  YES  #  #  #   Has patient had a PCN reaction causing severe rash involving mucus membranes or skin necrosis: UNSPECIFIED   Has patient had a PCN reaction that required hospitalization No   Lisinopril Cough   Causes Coughing      Medication List    STOP taking these medications   ibuprofen 200 MG tablet Commonly known as:  ADVIL,MOTRIN     TAKE these medications   amLODipine 10 MG tablet Commonly known as:  NORVASC Take 10 mg by mouth daily.   aspirin 81 MG chewable tablet Chew 1 tablet (81 mg total) by  mouth 2 (two) times daily. Take for 4 weeks, then resume regular dose.   benzonatate 100 MG capsule Commonly known as:  TESSALON Take 100 mg by mouth 3 (three) times daily as needed for cough.   buPROPion 150 MG 12 hr tablet Commonly known as:  WELLBUTRIN SR Take 150 mg by mouth every morning.   cetirizine 10 MG tablet Commonly known as:  ZYRTEC Take 10 mg by mouth  every morning.   docusate sodium 100 MG capsule Commonly known as:  COLACE Take 1 capsule (100 mg total) by mouth 2 (two) times daily.   DULoxetine 30 MG capsule Commonly known as:  CYMBALTA Take 30 mg by mouth daily.   ferrous sulfate 325 (65 FE) MG tablet Take 1 tablet (325 mg total) by mouth 3 (three) times daily with meals.   fluticasone 50 MCG/ACT nasal spray Commonly known as:  FLONASE Place 2 sprays into both nostrils 2 (two) times daily as needed for allergies or rhinitis.   hydrochlorothiazide 25 MG tablet Commonly known as:  HYDRODIURIL Take 25 mg by mouth every morning.   HYDROcodone-acetaminophen 7.5-325 MG tablet Commonly known as:  NORCO Take 1-2 tablets by mouth every 4 (four) hours as needed for moderate pain. What changed:  Another medication with the same name was removed. Continue taking this medication, and follow the directions you see here.   irbesartan 150 MG tablet Commonly known as:  AVAPRO Take 150 mg by mouth daily.   meclizine 25 MG tablet Commonly known as:  ANTIVERT Take 25 mg by mouth every 6 (six) hours as needed for dizziness.   Melatonin 10 MG Tabs Take 10 mg by mouth at bedtime.   methocarbamol 500 MG tablet Commonly known as:  ROBAXIN Take 1 tablet (500 mg total) by mouth every 6 (six) hours as needed for muscle spasms.   ondansetron 4 MG tablet Commonly known as:  ZOFRAN Take 1 tablet (4 mg total) by mouth every 6 (six) hours as needed for nausea or vomiting.   pantoprazole 40 MG tablet Commonly known as:  PROTONIX Take 40 mg by mouth 2 (two) times daily.     polyethylene glycol packet Commonly known as:  MIRALAX / GLYCOLAX Take 17 g by mouth 2 (two) times daily. What changed:    when to take this  reasons to take this   pravastatin 40 MG tablet Commonly known as:  PRAVACHOL Take 40 mg by mouth every morning.   traZODone 50 MG tablet Commonly known as:  DESYREL Take 50 mg by mouth at bedtime. Sleep.   valACYclovir 500 MG tablet Commonly known as:  VALTREX Take 500 mg by mouth 2 (two) times daily as needed (outbreaks).        Signed: Anastasio Auerbach. Nicole Klebba   PA-C  07/29/2018, 10:57 AM

## 2018-08-07 DIAGNOSIS — Z4789 Encounter for other orthopedic aftercare: Secondary | ICD-10-CM | POA: Diagnosis not present

## 2018-09-04 DIAGNOSIS — Z96651 Presence of right artificial knee joint: Secondary | ICD-10-CM | POA: Diagnosis not present

## 2018-09-04 DIAGNOSIS — E7849 Other hyperlipidemia: Secondary | ICD-10-CM | POA: Diagnosis not present

## 2018-09-04 DIAGNOSIS — M810 Age-related osteoporosis without current pathological fracture: Secondary | ICD-10-CM | POA: Diagnosis not present

## 2018-09-04 DIAGNOSIS — Z471 Aftercare following joint replacement surgery: Secondary | ICD-10-CM | POA: Diagnosis not present

## 2018-09-05 DIAGNOSIS — D649 Anemia, unspecified: Secondary | ICD-10-CM | POA: Diagnosis not present

## 2018-09-05 DIAGNOSIS — Z23 Encounter for immunization: Secondary | ICD-10-CM | POA: Diagnosis not present

## 2018-09-09 DIAGNOSIS — E7849 Other hyperlipidemia: Secondary | ICD-10-CM | POA: Diagnosis not present

## 2018-09-09 DIAGNOSIS — E038 Other specified hypothyroidism: Secondary | ICD-10-CM | POA: Diagnosis not present

## 2018-09-09 DIAGNOSIS — Z0000 Encounter for general adult medical examination without abnormal findings: Secondary | ICD-10-CM | POA: Diagnosis not present

## 2018-09-09 DIAGNOSIS — I10 Essential (primary) hypertension: Secondary | ICD-10-CM | POA: Diagnosis not present

## 2018-09-18 DIAGNOSIS — R262 Difficulty in walking, not elsewhere classified: Secondary | ICD-10-CM | POA: Diagnosis not present

## 2018-09-18 DIAGNOSIS — S76111D Strain of right quadriceps muscle, fascia and tendon, subsequent encounter: Secondary | ICD-10-CM | POA: Diagnosis not present

## 2018-09-18 DIAGNOSIS — Z96651 Presence of right artificial knee joint: Secondary | ICD-10-CM | POA: Diagnosis not present

## 2018-09-18 DIAGNOSIS — S82091D Other fracture of right patella, subsequent encounter for closed fracture with routine healing: Secondary | ICD-10-CM | POA: Diagnosis not present

## 2018-09-20 DIAGNOSIS — S76111D Strain of right quadriceps muscle, fascia and tendon, subsequent encounter: Secondary | ICD-10-CM | POA: Diagnosis not present

## 2018-09-20 DIAGNOSIS — R262 Difficulty in walking, not elsewhere classified: Secondary | ICD-10-CM | POA: Diagnosis not present

## 2018-09-20 DIAGNOSIS — S82091D Other fracture of right patella, subsequent encounter for closed fracture with routine healing: Secondary | ICD-10-CM | POA: Diagnosis not present

## 2018-09-20 DIAGNOSIS — Z96651 Presence of right artificial knee joint: Secondary | ICD-10-CM | POA: Diagnosis not present

## 2018-09-24 DIAGNOSIS — S76111D Strain of right quadriceps muscle, fascia and tendon, subsequent encounter: Secondary | ICD-10-CM | POA: Diagnosis not present

## 2018-09-24 DIAGNOSIS — Z96651 Presence of right artificial knee joint: Secondary | ICD-10-CM | POA: Diagnosis not present

## 2018-09-24 DIAGNOSIS — R262 Difficulty in walking, not elsewhere classified: Secondary | ICD-10-CM | POA: Diagnosis not present

## 2018-09-24 DIAGNOSIS — S82091D Other fracture of right patella, subsequent encounter for closed fracture with routine healing: Secondary | ICD-10-CM | POA: Diagnosis not present

## 2018-09-27 DIAGNOSIS — S76111D Strain of right quadriceps muscle, fascia and tendon, subsequent encounter: Secondary | ICD-10-CM | POA: Diagnosis not present

## 2018-09-27 DIAGNOSIS — Z96651 Presence of right artificial knee joint: Secondary | ICD-10-CM | POA: Diagnosis not present

## 2018-09-27 DIAGNOSIS — S82091D Other fracture of right patella, subsequent encounter for closed fracture with routine healing: Secondary | ICD-10-CM | POA: Diagnosis not present

## 2018-09-27 DIAGNOSIS — R262 Difficulty in walking, not elsewhere classified: Secondary | ICD-10-CM | POA: Diagnosis not present

## 2018-10-02 DIAGNOSIS — S76111D Strain of right quadriceps muscle, fascia and tendon, subsequent encounter: Secondary | ICD-10-CM | POA: Diagnosis not present

## 2018-10-02 DIAGNOSIS — Z96651 Presence of right artificial knee joint: Secondary | ICD-10-CM | POA: Diagnosis not present

## 2018-10-02 DIAGNOSIS — R262 Difficulty in walking, not elsewhere classified: Secondary | ICD-10-CM | POA: Diagnosis not present

## 2018-10-02 DIAGNOSIS — S82091D Other fracture of right patella, subsequent encounter for closed fracture with routine healing: Secondary | ICD-10-CM | POA: Diagnosis not present

## 2018-10-04 DIAGNOSIS — S76111D Strain of right quadriceps muscle, fascia and tendon, subsequent encounter: Secondary | ICD-10-CM | POA: Diagnosis not present

## 2018-10-04 DIAGNOSIS — R262 Difficulty in walking, not elsewhere classified: Secondary | ICD-10-CM | POA: Diagnosis not present

## 2018-10-04 DIAGNOSIS — Z96651 Presence of right artificial knee joint: Secondary | ICD-10-CM | POA: Diagnosis not present

## 2018-10-04 DIAGNOSIS — S82091D Other fracture of right patella, subsequent encounter for closed fracture with routine healing: Secondary | ICD-10-CM | POA: Diagnosis not present

## 2018-10-07 DIAGNOSIS — S76111D Strain of right quadriceps muscle, fascia and tendon, subsequent encounter: Secondary | ICD-10-CM | POA: Diagnosis not present

## 2018-10-07 DIAGNOSIS — Z96651 Presence of right artificial knee joint: Secondary | ICD-10-CM | POA: Diagnosis not present

## 2018-10-07 DIAGNOSIS — R262 Difficulty in walking, not elsewhere classified: Secondary | ICD-10-CM | POA: Diagnosis not present

## 2018-10-07 DIAGNOSIS — S82091D Other fracture of right patella, subsequent encounter for closed fracture with routine healing: Secondary | ICD-10-CM | POA: Diagnosis not present

## 2018-10-11 DIAGNOSIS — Z96651 Presence of right artificial knee joint: Secondary | ICD-10-CM | POA: Diagnosis not present

## 2018-10-11 DIAGNOSIS — R262 Difficulty in walking, not elsewhere classified: Secondary | ICD-10-CM | POA: Diagnosis not present

## 2018-10-11 DIAGNOSIS — S76111D Strain of right quadriceps muscle, fascia and tendon, subsequent encounter: Secondary | ICD-10-CM | POA: Diagnosis not present

## 2018-10-11 DIAGNOSIS — S82091D Other fracture of right patella, subsequent encounter for closed fracture with routine healing: Secondary | ICD-10-CM | POA: Diagnosis not present

## 2018-10-14 DIAGNOSIS — R262 Difficulty in walking, not elsewhere classified: Secondary | ICD-10-CM | POA: Diagnosis not present

## 2018-10-14 DIAGNOSIS — S76111D Strain of right quadriceps muscle, fascia and tendon, subsequent encounter: Secondary | ICD-10-CM | POA: Diagnosis not present

## 2018-10-14 DIAGNOSIS — S82091D Other fracture of right patella, subsequent encounter for closed fracture with routine healing: Secondary | ICD-10-CM | POA: Diagnosis not present

## 2018-10-14 DIAGNOSIS — Z96651 Presence of right artificial knee joint: Secondary | ICD-10-CM | POA: Diagnosis not present

## 2018-10-17 DIAGNOSIS — S82091D Other fracture of right patella, subsequent encounter for closed fracture with routine healing: Secondary | ICD-10-CM | POA: Diagnosis not present

## 2018-10-17 DIAGNOSIS — Z96651 Presence of right artificial knee joint: Secondary | ICD-10-CM | POA: Diagnosis not present

## 2018-10-17 DIAGNOSIS — M25561 Pain in right knee: Secondary | ICD-10-CM | POA: Diagnosis not present

## 2018-10-17 DIAGNOSIS — S76111D Strain of right quadriceps muscle, fascia and tendon, subsequent encounter: Secondary | ICD-10-CM | POA: Diagnosis not present

## 2018-10-17 DIAGNOSIS — R262 Difficulty in walking, not elsewhere classified: Secondary | ICD-10-CM | POA: Diagnosis not present

## 2018-10-17 DIAGNOSIS — R6889 Other general symptoms and signs: Secondary | ICD-10-CM | POA: Diagnosis not present

## 2018-10-21 DIAGNOSIS — S82091D Other fracture of right patella, subsequent encounter for closed fracture with routine healing: Secondary | ICD-10-CM | POA: Diagnosis not present

## 2018-10-21 DIAGNOSIS — R262 Difficulty in walking, not elsewhere classified: Secondary | ICD-10-CM | POA: Diagnosis not present

## 2018-10-21 DIAGNOSIS — M25561 Pain in right knee: Secondary | ICD-10-CM | POA: Diagnosis not present

## 2018-10-21 DIAGNOSIS — S76111D Strain of right quadriceps muscle, fascia and tendon, subsequent encounter: Secondary | ICD-10-CM | POA: Diagnosis not present

## 2018-10-21 DIAGNOSIS — Z96651 Presence of right artificial knee joint: Secondary | ICD-10-CM | POA: Diagnosis not present

## 2018-10-21 DIAGNOSIS — R6889 Other general symptoms and signs: Secondary | ICD-10-CM | POA: Diagnosis not present

## 2018-10-23 DIAGNOSIS — Z4789 Encounter for other orthopedic aftercare: Secondary | ICD-10-CM | POA: Diagnosis not present

## 2018-10-23 DIAGNOSIS — Z96651 Presence of right artificial knee joint: Secondary | ICD-10-CM | POA: Diagnosis not present

## 2018-10-23 DIAGNOSIS — M25361 Other instability, right knee: Secondary | ICD-10-CM | POA: Diagnosis not present

## 2018-10-23 DIAGNOSIS — Z471 Aftercare following joint replacement surgery: Secondary | ICD-10-CM | POA: Diagnosis not present

## 2018-11-05 DIAGNOSIS — I10 Essential (primary) hypertension: Secondary | ICD-10-CM | POA: Diagnosis not present

## 2018-11-05 DIAGNOSIS — R7989 Other specified abnormal findings of blood chemistry: Secondary | ICD-10-CM | POA: Diagnosis not present

## 2018-11-05 DIAGNOSIS — Z23 Encounter for immunization: Secondary | ICD-10-CM | POA: Diagnosis not present

## 2018-11-05 DIAGNOSIS — E038 Other specified hypothyroidism: Secondary | ICD-10-CM | POA: Diagnosis not present

## 2018-12-17 DIAGNOSIS — E039 Hypothyroidism, unspecified: Secondary | ICD-10-CM | POA: Diagnosis not present

## 2018-12-17 DIAGNOSIS — N183 Chronic kidney disease, stage 3 (moderate): Secondary | ICD-10-CM | POA: Diagnosis not present

## 2018-12-17 DIAGNOSIS — I129 Hypertensive chronic kidney disease with stage 1 through stage 4 chronic kidney disease, or unspecified chronic kidney disease: Secondary | ICD-10-CM | POA: Diagnosis not present

## 2018-12-17 DIAGNOSIS — D649 Anemia, unspecified: Secondary | ICD-10-CM | POA: Diagnosis not present

## 2019-03-12 DIAGNOSIS — E039 Hypothyroidism, unspecified: Secondary | ICD-10-CM | POA: Diagnosis not present

## 2019-03-12 DIAGNOSIS — N183 Chronic kidney disease, stage 3 (moderate): Secondary | ICD-10-CM | POA: Diagnosis not present

## 2019-03-12 DIAGNOSIS — E669 Obesity, unspecified: Secondary | ICD-10-CM | POA: Diagnosis not present

## 2019-03-12 DIAGNOSIS — I129 Hypertensive chronic kidney disease with stage 1 through stage 4 chronic kidney disease, or unspecified chronic kidney disease: Secondary | ICD-10-CM | POA: Diagnosis not present

## 2019-03-18 DIAGNOSIS — I129 Hypertensive chronic kidney disease with stage 1 through stage 4 chronic kidney disease, or unspecified chronic kidney disease: Secondary | ICD-10-CM | POA: Diagnosis not present

## 2019-03-18 DIAGNOSIS — E038 Other specified hypothyroidism: Secondary | ICD-10-CM | POA: Diagnosis not present

## 2019-03-18 DIAGNOSIS — R7989 Other specified abnormal findings of blood chemistry: Secondary | ICD-10-CM | POA: Diagnosis not present

## 2019-05-06 DIAGNOSIS — R296 Repeated falls: Secondary | ICD-10-CM | POA: Diagnosis not present

## 2019-05-06 DIAGNOSIS — N183 Chronic kidney disease, stage 3 (moderate): Secondary | ICD-10-CM | POA: Diagnosis not present

## 2019-05-06 DIAGNOSIS — I129 Hypertensive chronic kidney disease with stage 1 through stage 4 chronic kidney disease, or unspecified chronic kidney disease: Secondary | ICD-10-CM | POA: Diagnosis not present

## 2019-05-06 DIAGNOSIS — E669 Obesity, unspecified: Secondary | ICD-10-CM | POA: Diagnosis not present

## 2019-06-12 DIAGNOSIS — I447 Left bundle-branch block, unspecified: Secondary | ICD-10-CM | POA: Diagnosis not present

## 2019-06-12 DIAGNOSIS — I10 Essential (primary) hypertension: Secondary | ICD-10-CM | POA: Diagnosis not present

## 2019-06-12 DIAGNOSIS — R55 Syncope and collapse: Secondary | ICD-10-CM | POA: Diagnosis not present

## 2019-06-13 DIAGNOSIS — N3941 Urge incontinence: Secondary | ICD-10-CM | POA: Diagnosis not present

## 2019-07-16 DIAGNOSIS — N3941 Urge incontinence: Secondary | ICD-10-CM | POA: Diagnosis not present

## 2019-07-28 DIAGNOSIS — Y998 Other external cause status: Secondary | ICD-10-CM | POA: Diagnosis not present

## 2019-07-28 DIAGNOSIS — S92101A Unspecified fracture of right talus, initial encounter for closed fracture: Secondary | ICD-10-CM | POA: Diagnosis not present

## 2019-07-28 DIAGNOSIS — S9001XA Contusion of right ankle, initial encounter: Secondary | ICD-10-CM | POA: Diagnosis not present

## 2019-07-28 DIAGNOSIS — Z96651 Presence of right artificial knee joint: Secondary | ICD-10-CM | POA: Diagnosis not present

## 2019-07-28 DIAGNOSIS — M25572 Pain in left ankle and joints of left foot: Secondary | ICD-10-CM | POA: Diagnosis not present

## 2019-07-28 DIAGNOSIS — M25562 Pain in left knee: Secondary | ICD-10-CM | POA: Diagnosis not present

## 2019-07-28 DIAGNOSIS — M79662 Pain in left lower leg: Secondary | ICD-10-CM | POA: Diagnosis not present

## 2019-07-28 DIAGNOSIS — S9002XA Contusion of left ankle, initial encounter: Secondary | ICD-10-CM | POA: Diagnosis not present

## 2019-07-28 DIAGNOSIS — W1839XA Other fall on same level, initial encounter: Secondary | ICD-10-CM | POA: Diagnosis not present

## 2019-07-28 DIAGNOSIS — S82832A Other fracture of upper and lower end of left fibula, initial encounter for closed fracture: Secondary | ICD-10-CM | POA: Diagnosis not present

## 2019-07-30 DIAGNOSIS — Z96652 Presence of left artificial knee joint: Secondary | ICD-10-CM | POA: Diagnosis not present

## 2019-07-30 DIAGNOSIS — M79671 Pain in right foot: Secondary | ICD-10-CM | POA: Diagnosis not present

## 2019-07-30 DIAGNOSIS — S82832A Other fracture of upper and lower end of left fibula, initial encounter for closed fracture: Secondary | ICD-10-CM | POA: Diagnosis not present

## 2019-10-19 IMAGING — CR DG KNEE COMPLETE 4+V*R*
5 series · 5 of 5 positions shown · non-contrast
Comparison: None.

CLINICAL DATA: Recent right total knee arthroplasty. Popping sound
in the right knee. Right knee pain.

EXAM:
RIGHT KNEE - COMPLETE 4+ VIEW

[t knee ap right]
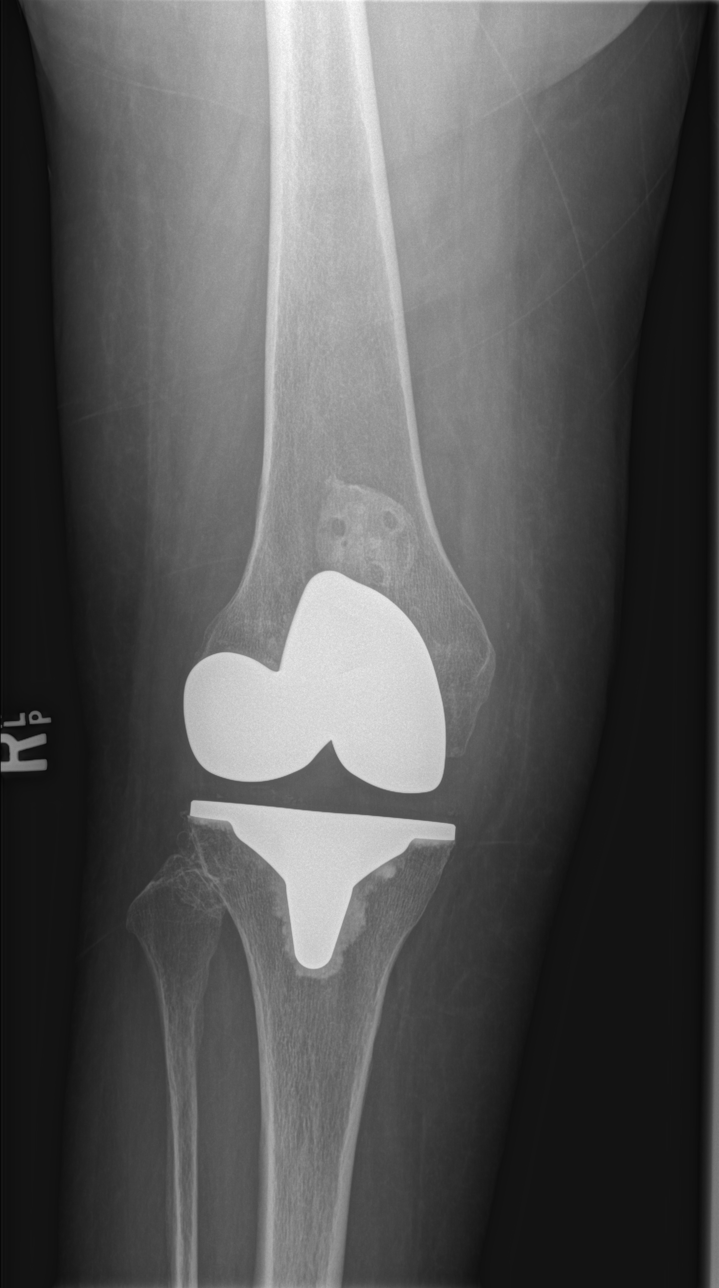

[t knee obl right (1 of 3)]
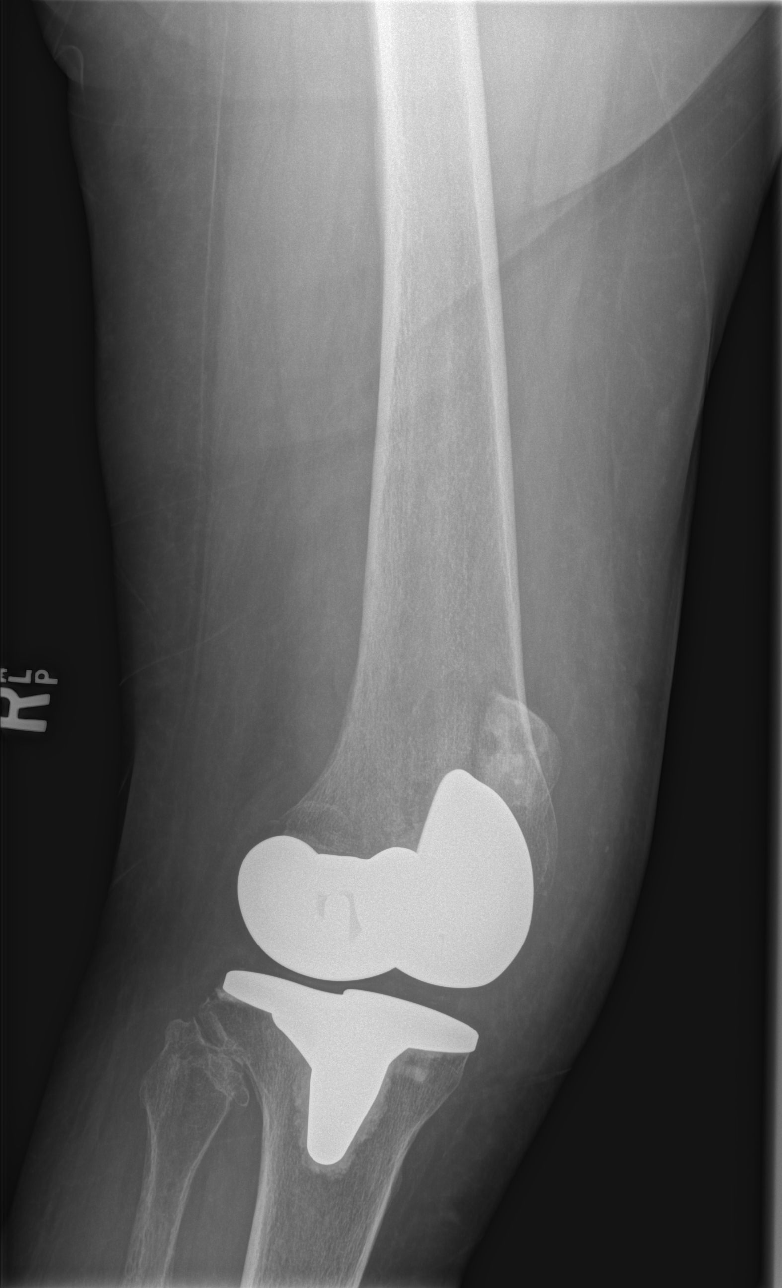

[t knee obl right (2 of 3)]
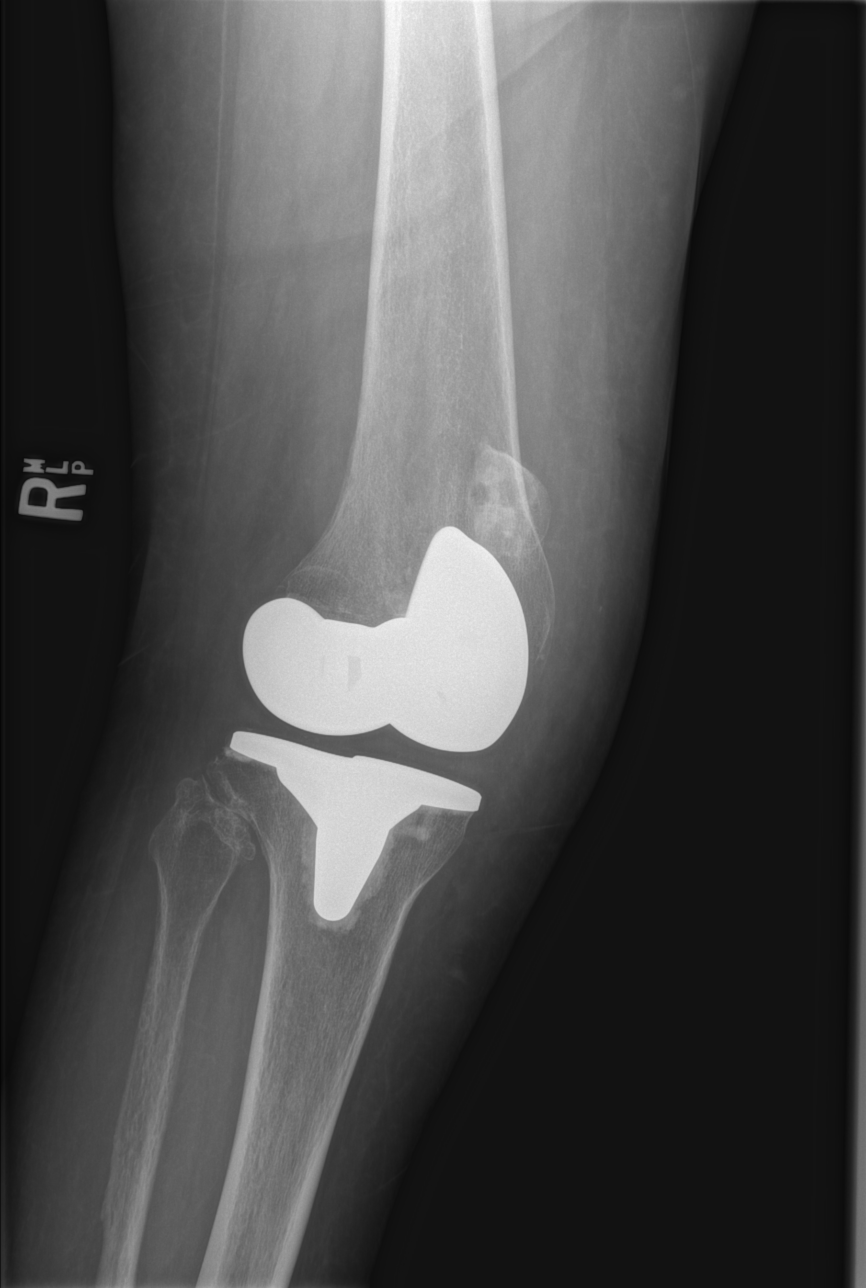

[t knee obl right (3 of 3)]
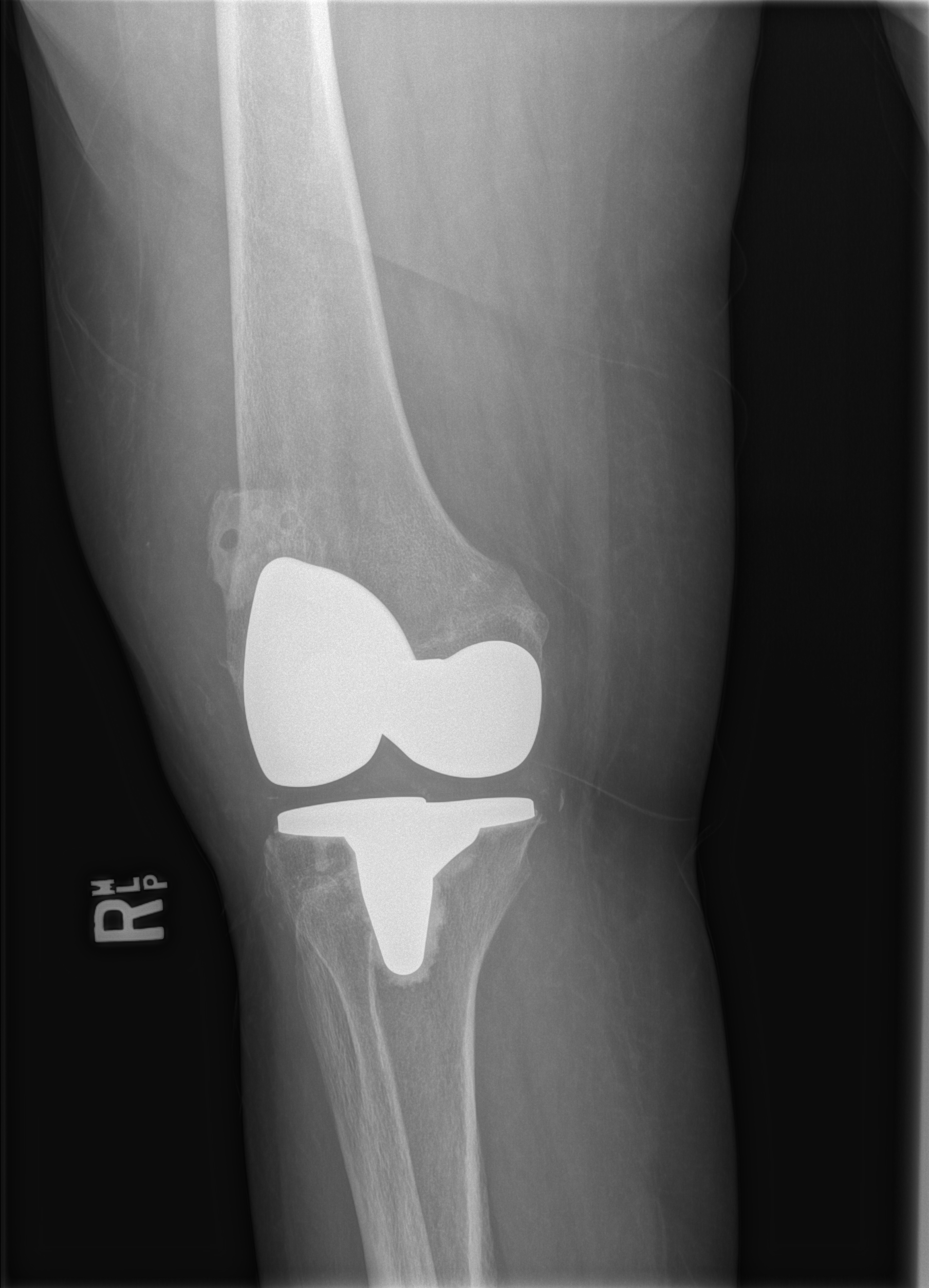

[t knee lat right]
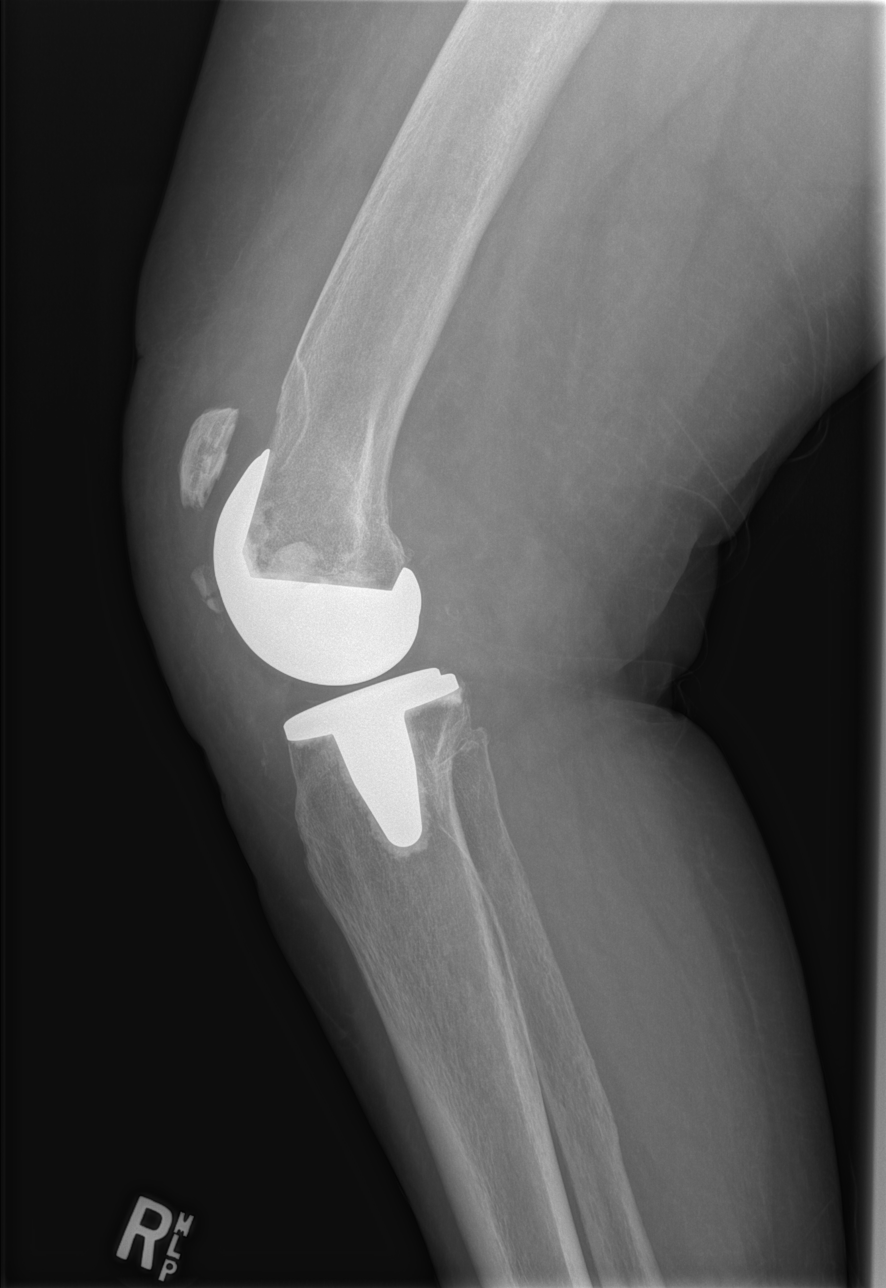

[5 of 5 positions shown; findings below may reference images not displayed]

FINDINGS: Status post right total knee arthroplasty, with no evidence of
hardware fracture or loosening. There is patella alta with apparent
fracture of inferior patellar enthesophyte, which is 17 mm
inferiorly displaced from the patella. No additional fracture. No
suspicious focal osseous lesions. No dislocation. Small
suprapatellar right knee joint effusion.
IMPRESSION: 1. Patella alta. Apparent fracture of inferior patellar enthesophyte
at the lower tip of the patella.
2. Small suprapatellar right knee joint effusion.
3. No evidence of hardware complication.

## 2019-12-17 DIAGNOSIS — S86911A Strain of unspecified muscle(s) and tendon(s) at lower leg level, right leg, initial encounter: Secondary | ICD-10-CM | POA: Diagnosis not present

## 2019-12-17 DIAGNOSIS — R2241 Localized swelling, mass and lump, right lower limb: Secondary | ICD-10-CM | POA: Diagnosis not present

## 2019-12-17 DIAGNOSIS — J40 Bronchitis, not specified as acute or chronic: Secondary | ICD-10-CM | POA: Diagnosis not present

## 2019-12-17 DIAGNOSIS — G8911 Acute pain due to trauma: Secondary | ICD-10-CM | POA: Diagnosis not present

## 2019-12-17 DIAGNOSIS — R0602 Shortness of breath: Secondary | ICD-10-CM | POA: Diagnosis not present

## 2019-12-17 DIAGNOSIS — S86811A Strain of other muscle(s) and tendon(s) at lower leg level, right leg, initial encounter: Secondary | ICD-10-CM | POA: Diagnosis not present

## 2019-12-17 DIAGNOSIS — I10 Essential (primary) hypertension: Secondary | ICD-10-CM | POA: Diagnosis not present

## 2019-12-17 DIAGNOSIS — J42 Unspecified chronic bronchitis: Secondary | ICD-10-CM | POA: Diagnosis not present

## 2019-12-17 DIAGNOSIS — Z880 Allergy status to penicillin: Secondary | ICD-10-CM | POA: Diagnosis not present

## 2019-12-17 DIAGNOSIS — Z20822 Contact with and (suspected) exposure to covid-19: Secondary | ICD-10-CM | POA: Diagnosis not present

## 2019-12-27 DIAGNOSIS — Z880 Allergy status to penicillin: Secondary | ICD-10-CM | POA: Diagnosis not present

## 2019-12-27 DIAGNOSIS — M79601 Pain in right arm: Secondary | ICD-10-CM | POA: Diagnosis not present

## 2019-12-27 DIAGNOSIS — I808 Phlebitis and thrombophlebitis of other sites: Secondary | ICD-10-CM | POA: Diagnosis not present

## 2019-12-27 DIAGNOSIS — I10 Essential (primary) hypertension: Secondary | ICD-10-CM | POA: Diagnosis not present

## 2019-12-27 DIAGNOSIS — I82611 Acute embolism and thrombosis of superficial veins of right upper extremity: Secondary | ICD-10-CM | POA: Diagnosis not present

## 2020-01-21 ENCOUNTER — Other Ambulatory Visit: Payer: Self-pay | Admitting: Internal Medicine

## 2020-01-21 DIAGNOSIS — Z1231 Encounter for screening mammogram for malignant neoplasm of breast: Secondary | ICD-10-CM

## 2020-02-09 DIAGNOSIS — R079 Chest pain, unspecified: Secondary | ICD-10-CM | POA: Diagnosis not present

## 2020-02-09 DIAGNOSIS — Z880 Allergy status to penicillin: Secondary | ICD-10-CM | POA: Diagnosis not present

## 2020-02-09 DIAGNOSIS — R9431 Abnormal electrocardiogram [ECG] [EKG]: Secondary | ICD-10-CM | POA: Diagnosis not present

## 2020-02-09 DIAGNOSIS — I493 Ventricular premature depolarization: Secondary | ICD-10-CM | POA: Diagnosis not present

## 2020-02-09 DIAGNOSIS — I517 Cardiomegaly: Secondary | ICD-10-CM | POA: Diagnosis not present

## 2020-02-09 DIAGNOSIS — R0602 Shortness of breath: Secondary | ICD-10-CM | POA: Diagnosis not present

## 2020-02-09 DIAGNOSIS — R7989 Other specified abnormal findings of blood chemistry: Secondary | ICD-10-CM | POA: Diagnosis not present

## 2020-02-09 DIAGNOSIS — I119 Hypertensive heart disease without heart failure: Secondary | ICD-10-CM | POA: Diagnosis not present

## 2020-02-09 DIAGNOSIS — J90 Pleural effusion, not elsewhere classified: Secondary | ICD-10-CM | POA: Diagnosis not present

## 2020-02-09 DIAGNOSIS — I447 Left bundle-branch block, unspecified: Secondary | ICD-10-CM | POA: Diagnosis not present

## 2020-02-09 DIAGNOSIS — J810 Acute pulmonary edema: Secondary | ICD-10-CM | POA: Diagnosis not present

## 2020-02-09 DIAGNOSIS — Z20822 Contact with and (suspected) exposure to covid-19: Secondary | ICD-10-CM | POA: Diagnosis not present

## 2020-02-09 DIAGNOSIS — J811 Chronic pulmonary edema: Secondary | ICD-10-CM | POA: Diagnosis not present

## 2020-02-09 DIAGNOSIS — R918 Other nonspecific abnormal finding of lung field: Secondary | ICD-10-CM | POA: Diagnosis not present

## 2020-02-11 DIAGNOSIS — E039 Hypothyroidism, unspecified: Secondary | ICD-10-CM | POA: Diagnosis not present

## 2020-02-11 DIAGNOSIS — I517 Cardiomegaly: Secondary | ICD-10-CM | POA: Diagnosis not present

## 2020-02-11 DIAGNOSIS — E785 Hyperlipidemia, unspecified: Secondary | ICD-10-CM | POA: Diagnosis not present

## 2020-02-11 DIAGNOSIS — D649 Anemia, unspecified: Secondary | ICD-10-CM | POA: Diagnosis not present

## 2020-02-11 DIAGNOSIS — I509 Heart failure, unspecified: Secondary | ICD-10-CM | POA: Diagnosis not present

## 2020-02-11 DIAGNOSIS — I10 Essential (primary) hypertension: Secondary | ICD-10-CM | POA: Diagnosis not present

## 2020-02-16 DIAGNOSIS — I10 Essential (primary) hypertension: Secondary | ICD-10-CM | POA: Diagnosis not present

## 2020-02-16 DIAGNOSIS — R0600 Dyspnea, unspecified: Secondary | ICD-10-CM | POA: Diagnosis not present

## 2020-02-16 DIAGNOSIS — E782 Mixed hyperlipidemia: Secondary | ICD-10-CM | POA: Diagnosis not present

## 2020-02-16 DIAGNOSIS — I5031 Acute diastolic (congestive) heart failure: Secondary | ICD-10-CM | POA: Diagnosis not present

## 2020-02-18 DIAGNOSIS — E039 Hypothyroidism, unspecified: Secondary | ICD-10-CM | POA: Diagnosis not present

## 2020-02-18 DIAGNOSIS — I517 Cardiomegaly: Secondary | ICD-10-CM | POA: Diagnosis not present

## 2020-02-18 DIAGNOSIS — I10 Essential (primary) hypertension: Secondary | ICD-10-CM | POA: Diagnosis not present

## 2020-02-18 DIAGNOSIS — I509 Heart failure, unspecified: Secondary | ICD-10-CM | POA: Diagnosis not present

## 2020-03-03 DIAGNOSIS — E039 Hypothyroidism, unspecified: Secondary | ICD-10-CM | POA: Diagnosis not present

## 2020-03-03 DIAGNOSIS — I517 Cardiomegaly: Secondary | ICD-10-CM | POA: Diagnosis not present

## 2020-03-03 DIAGNOSIS — I509 Heart failure, unspecified: Secondary | ICD-10-CM | POA: Diagnosis not present

## 2020-03-03 DIAGNOSIS — I10 Essential (primary) hypertension: Secondary | ICD-10-CM | POA: Diagnosis not present

## 2020-03-09 DIAGNOSIS — E782 Mixed hyperlipidemia: Secondary | ICD-10-CM | POA: Diagnosis not present

## 2020-03-09 DIAGNOSIS — I5031 Acute diastolic (congestive) heart failure: Secondary | ICD-10-CM | POA: Diagnosis not present

## 2020-03-09 DIAGNOSIS — R0600 Dyspnea, unspecified: Secondary | ICD-10-CM | POA: Diagnosis not present

## 2020-03-09 DIAGNOSIS — I10 Essential (primary) hypertension: Secondary | ICD-10-CM | POA: Diagnosis not present

## 2020-03-10 DIAGNOSIS — R0600 Dyspnea, unspecified: Secondary | ICD-10-CM | POA: Diagnosis not present

## 2020-03-10 DIAGNOSIS — I5031 Acute diastolic (congestive) heart failure: Secondary | ICD-10-CM | POA: Diagnosis not present

## 2020-03-10 DIAGNOSIS — I10 Essential (primary) hypertension: Secondary | ICD-10-CM | POA: Diagnosis not present

## 2020-03-10 DIAGNOSIS — E782 Mixed hyperlipidemia: Secondary | ICD-10-CM | POA: Diagnosis not present

## 2020-03-18 DIAGNOSIS — I10 Essential (primary) hypertension: Secondary | ICD-10-CM | POA: Diagnosis not present

## 2020-03-18 DIAGNOSIS — R0602 Shortness of breath: Secondary | ICD-10-CM | POA: Diagnosis not present

## 2020-03-18 DIAGNOSIS — I255 Ischemic cardiomyopathy: Secondary | ICD-10-CM | POA: Diagnosis not present

## 2020-03-18 DIAGNOSIS — E782 Mixed hyperlipidemia: Secondary | ICD-10-CM | POA: Diagnosis not present

## 2020-05-04 DIAGNOSIS — I10 Essential (primary) hypertension: Secondary | ICD-10-CM | POA: Diagnosis not present

## 2020-05-04 DIAGNOSIS — I255 Ischemic cardiomyopathy: Secondary | ICD-10-CM | POA: Diagnosis not present

## 2020-05-04 DIAGNOSIS — E782 Mixed hyperlipidemia: Secondary | ICD-10-CM | POA: Diagnosis not present

## 2020-05-04 DIAGNOSIS — R0602 Shortness of breath: Secondary | ICD-10-CM | POA: Diagnosis not present

## 2020-06-02 DIAGNOSIS — I509 Heart failure, unspecified: Secondary | ICD-10-CM | POA: Diagnosis not present

## 2020-06-02 DIAGNOSIS — R799 Abnormal finding of blood chemistry, unspecified: Secondary | ICD-10-CM | POA: Diagnosis not present

## 2020-06-02 DIAGNOSIS — I517 Cardiomegaly: Secondary | ICD-10-CM | POA: Diagnosis not present

## 2020-06-02 DIAGNOSIS — I10 Essential (primary) hypertension: Secondary | ICD-10-CM | POA: Diagnosis not present

## 2020-06-02 DIAGNOSIS — E039 Hypothyroidism, unspecified: Secondary | ICD-10-CM | POA: Diagnosis not present

## 2020-06-02 DIAGNOSIS — F331 Major depressive disorder, recurrent, moderate: Secondary | ICD-10-CM | POA: Diagnosis not present

## 2020-09-01 DIAGNOSIS — E039 Hypothyroidism, unspecified: Secondary | ICD-10-CM | POA: Diagnosis not present

## 2020-09-01 DIAGNOSIS — I517 Cardiomegaly: Secondary | ICD-10-CM | POA: Diagnosis not present

## 2020-09-01 DIAGNOSIS — I509 Heart failure, unspecified: Secondary | ICD-10-CM | POA: Diagnosis not present

## 2020-09-01 DIAGNOSIS — I10 Essential (primary) hypertension: Secondary | ICD-10-CM | POA: Diagnosis not present

## 2020-09-23 DIAGNOSIS — I255 Ischemic cardiomyopathy: Secondary | ICD-10-CM | POA: Diagnosis not present

## 2020-09-23 DIAGNOSIS — I088 Other rheumatic multiple valve diseases: Secondary | ICD-10-CM | POA: Diagnosis not present

## 2020-09-23 DIAGNOSIS — I77810 Thoracic aortic ectasia: Secondary | ICD-10-CM | POA: Diagnosis not present

## 2020-09-23 DIAGNOSIS — I2510 Atherosclerotic heart disease of native coronary artery without angina pectoris: Secondary | ICD-10-CM | POA: Diagnosis not present

## 2020-09-23 DIAGNOSIS — I10 Essential (primary) hypertension: Secondary | ICD-10-CM | POA: Diagnosis not present

## 2020-09-23 DIAGNOSIS — I517 Cardiomegaly: Secondary | ICD-10-CM | POA: Diagnosis not present

## 2020-09-23 DIAGNOSIS — E785 Hyperlipidemia, unspecified: Secondary | ICD-10-CM | POA: Diagnosis not present

## 2020-10-26 DIAGNOSIS — Z01812 Encounter for preprocedural laboratory examination: Secondary | ICD-10-CM | POA: Diagnosis not present

## 2020-10-26 DIAGNOSIS — Z79899 Other long term (current) drug therapy: Secondary | ICD-10-CM | POA: Diagnosis not present

## 2020-10-26 DIAGNOSIS — I255 Ischemic cardiomyopathy: Secondary | ICD-10-CM | POA: Diagnosis not present

## 2020-10-26 DIAGNOSIS — Z20822 Contact with and (suspected) exposure to covid-19: Secondary | ICD-10-CM | POA: Diagnosis not present

## 2020-10-26 DIAGNOSIS — R791 Abnormal coagulation profile: Secondary | ICD-10-CM | POA: Diagnosis not present

## 2020-10-26 DIAGNOSIS — I10 Essential (primary) hypertension: Secondary | ICD-10-CM | POA: Diagnosis not present

## 2020-10-26 DIAGNOSIS — I2510 Atherosclerotic heart disease of native coronary artery without angina pectoris: Secondary | ICD-10-CM | POA: Diagnosis not present

## 2020-10-26 DIAGNOSIS — E785 Hyperlipidemia, unspecified: Secondary | ICD-10-CM | POA: Diagnosis not present

## 2020-10-27 DIAGNOSIS — I509 Heart failure, unspecified: Secondary | ICD-10-CM | POA: Diagnosis not present

## 2020-10-27 DIAGNOSIS — I517 Cardiomegaly: Secondary | ICD-10-CM | POA: Diagnosis not present

## 2020-10-27 DIAGNOSIS — I10 Essential (primary) hypertension: Secondary | ICD-10-CM | POA: Diagnosis not present

## 2020-10-27 DIAGNOSIS — Z6834 Body mass index (BMI) 34.0-34.9, adult: Secondary | ICD-10-CM | POA: Diagnosis not present

## 2020-10-27 DIAGNOSIS — E039 Hypothyroidism, unspecified: Secondary | ICD-10-CM | POA: Diagnosis not present

## 2020-10-28 DIAGNOSIS — Z888 Allergy status to other drugs, medicaments and biological substances status: Secondary | ICD-10-CM | POA: Diagnosis not present

## 2020-10-28 DIAGNOSIS — I10 Essential (primary) hypertension: Secondary | ICD-10-CM | POA: Diagnosis not present

## 2020-10-28 DIAGNOSIS — Z96652 Presence of left artificial knee joint: Secondary | ICD-10-CM | POA: Diagnosis not present

## 2020-10-28 DIAGNOSIS — I2510 Atherosclerotic heart disease of native coronary artery without angina pectoris: Secondary | ICD-10-CM | POA: Diagnosis not present

## 2020-10-28 DIAGNOSIS — N289 Disorder of kidney and ureter, unspecified: Secondary | ICD-10-CM | POA: Diagnosis not present

## 2020-10-28 DIAGNOSIS — Z8249 Family history of ischemic heart disease and other diseases of the circulatory system: Secondary | ICD-10-CM | POA: Diagnosis not present

## 2020-10-28 DIAGNOSIS — E785 Hyperlipidemia, unspecified: Secondary | ICD-10-CM | POA: Diagnosis not present

## 2020-10-28 DIAGNOSIS — Z880 Allergy status to penicillin: Secondary | ICD-10-CM | POA: Diagnosis not present

## 2020-10-28 DIAGNOSIS — Z885 Allergy status to narcotic agent status: Secondary | ICD-10-CM | POA: Diagnosis not present

## 2020-10-28 DIAGNOSIS — R9430 Abnormal result of cardiovascular function study, unspecified: Secondary | ICD-10-CM | POA: Diagnosis not present

## 2020-10-28 DIAGNOSIS — Z79899 Other long term (current) drug therapy: Secondary | ICD-10-CM | POA: Diagnosis not present

## 2020-10-28 DIAGNOSIS — Z79890 Hormone replacement therapy (postmenopausal): Secondary | ICD-10-CM | POA: Diagnosis not present

## 2020-11-17 DIAGNOSIS — Z1152 Encounter for screening for COVID-19: Secondary | ICD-10-CM | POA: Diagnosis not present

## 2020-11-17 DIAGNOSIS — R6883 Chills (without fever): Secondary | ICD-10-CM | POA: Diagnosis not present

## 2020-11-17 DIAGNOSIS — R0602 Shortness of breath: Secondary | ICD-10-CM | POA: Diagnosis not present

## 2020-11-17 DIAGNOSIS — R051 Acute cough: Secondary | ICD-10-CM | POA: Diagnosis not present

## 2020-11-17 DIAGNOSIS — R5383 Other fatigue: Secondary | ICD-10-CM | POA: Diagnosis not present

## 2020-11-17 DIAGNOSIS — R062 Wheezing: Secondary | ICD-10-CM | POA: Diagnosis not present

## 2020-11-17 DIAGNOSIS — R519 Headache, unspecified: Secondary | ICD-10-CM | POA: Diagnosis not present

## 2020-12-02 DIAGNOSIS — U071 COVID-19: Secondary | ICD-10-CM | POA: Diagnosis not present

## 2021-03-08 DIAGNOSIS — E785 Hyperlipidemia, unspecified: Secondary | ICD-10-CM | POA: Diagnosis not present

## 2021-03-08 DIAGNOSIS — I10 Essential (primary) hypertension: Secondary | ICD-10-CM | POA: Diagnosis not present

## 2021-03-08 DIAGNOSIS — I509 Heart failure, unspecified: Secondary | ICD-10-CM | POA: Diagnosis not present

## 2021-03-08 DIAGNOSIS — I517 Cardiomegaly: Secondary | ICD-10-CM | POA: Diagnosis not present

## 2021-04-04 DIAGNOSIS — I2510 Atherosclerotic heart disease of native coronary artery without angina pectoris: Secondary | ICD-10-CM | POA: Diagnosis not present

## 2021-04-04 DIAGNOSIS — I428 Other cardiomyopathies: Secondary | ICD-10-CM | POA: Diagnosis not present

## 2021-04-04 DIAGNOSIS — I10 Essential (primary) hypertension: Secondary | ICD-10-CM | POA: Diagnosis not present

## 2021-04-04 DIAGNOSIS — I255 Ischemic cardiomyopathy: Secondary | ICD-10-CM | POA: Diagnosis not present

## 2021-04-04 DIAGNOSIS — R55 Syncope and collapse: Secondary | ICD-10-CM | POA: Diagnosis not present

## 2021-04-26 DIAGNOSIS — I472 Ventricular tachycardia: Secondary | ICD-10-CM | POA: Diagnosis not present

## 2021-05-06 DIAGNOSIS — I2510 Atherosclerotic heart disease of native coronary artery without angina pectoris: Secondary | ICD-10-CM | POA: Diagnosis not present

## 2021-05-06 DIAGNOSIS — I255 Ischemic cardiomyopathy: Secondary | ICD-10-CM | POA: Diagnosis not present

## 2021-05-06 DIAGNOSIS — I428 Other cardiomyopathies: Secondary | ICD-10-CM | POA: Diagnosis not present

## 2021-06-10 DIAGNOSIS — Z79899 Other long term (current) drug therapy: Secondary | ICD-10-CM | POA: Diagnosis not present

## 2021-06-15 DIAGNOSIS — I2510 Atherosclerotic heart disease of native coronary artery without angina pectoris: Secondary | ICD-10-CM | POA: Diagnosis not present

## 2021-06-15 DIAGNOSIS — I255 Ischemic cardiomyopathy: Secondary | ICD-10-CM | POA: Diagnosis not present

## 2021-06-15 DIAGNOSIS — E782 Mixed hyperlipidemia: Secondary | ICD-10-CM | POA: Diagnosis not present

## 2021-06-15 DIAGNOSIS — I10 Essential (primary) hypertension: Secondary | ICD-10-CM | POA: Diagnosis not present

## 2021-07-13 DIAGNOSIS — Z0000 Encounter for general adult medical examination without abnormal findings: Secondary | ICD-10-CM | POA: Diagnosis not present

## 2021-07-13 DIAGNOSIS — F331 Major depressive disorder, recurrent, moderate: Secondary | ICD-10-CM | POA: Diagnosis not present

## 2021-07-13 DIAGNOSIS — R54 Age-related physical debility: Secondary | ICD-10-CM | POA: Diagnosis not present

## 2021-07-13 DIAGNOSIS — E785 Hyperlipidemia, unspecified: Secondary | ICD-10-CM | POA: Diagnosis not present

## 2021-08-04 DIAGNOSIS — I10 Essential (primary) hypertension: Secondary | ICD-10-CM | POA: Diagnosis not present

## 2021-08-04 DIAGNOSIS — R112 Nausea with vomiting, unspecified: Secondary | ICD-10-CM | POA: Diagnosis not present

## 2021-10-25 DIAGNOSIS — F331 Major depressive disorder, recurrent, moderate: Secondary | ICD-10-CM | POA: Diagnosis not present

## 2021-10-25 DIAGNOSIS — I509 Heart failure, unspecified: Secondary | ICD-10-CM | POA: Diagnosis not present

## 2021-10-25 DIAGNOSIS — R059 Cough, unspecified: Secondary | ICD-10-CM | POA: Diagnosis not present

## 2021-10-25 DIAGNOSIS — R54 Age-related physical debility: Secondary | ICD-10-CM | POA: Diagnosis not present

## 2021-10-30 DIAGNOSIS — Z885 Allergy status to narcotic agent status: Secondary | ICD-10-CM | POA: Diagnosis not present

## 2021-10-30 DIAGNOSIS — Z7982 Long term (current) use of aspirin: Secondary | ICD-10-CM | POA: Diagnosis not present

## 2021-10-30 DIAGNOSIS — I252 Old myocardial infarction: Secondary | ICD-10-CM | POA: Diagnosis not present

## 2021-10-30 DIAGNOSIS — Z20822 Contact with and (suspected) exposure to covid-19: Secondary | ICD-10-CM | POA: Diagnosis not present

## 2021-10-30 DIAGNOSIS — Z79890 Hormone replacement therapy (postmenopausal): Secondary | ICD-10-CM | POA: Diagnosis not present

## 2021-10-30 DIAGNOSIS — Z79899 Other long term (current) drug therapy: Secondary | ICD-10-CM | POA: Diagnosis not present

## 2021-10-30 DIAGNOSIS — I110 Hypertensive heart disease with heart failure: Secondary | ICD-10-CM | POA: Diagnosis not present

## 2021-10-30 DIAGNOSIS — K449 Diaphragmatic hernia without obstruction or gangrene: Secondary | ICD-10-CM | POA: Diagnosis not present

## 2021-10-30 DIAGNOSIS — R079 Chest pain, unspecified: Secondary | ICD-10-CM | POA: Diagnosis not present

## 2021-10-30 DIAGNOSIS — Z888 Allergy status to other drugs, medicaments and biological substances status: Secondary | ICD-10-CM | POA: Diagnosis not present

## 2021-10-30 DIAGNOSIS — R0789 Other chest pain: Secondary | ICD-10-CM | POA: Diagnosis not present

## 2021-10-30 DIAGNOSIS — Z880 Allergy status to penicillin: Secondary | ICD-10-CM | POA: Diagnosis not present

## 2021-10-30 DIAGNOSIS — R918 Other nonspecific abnormal finding of lung field: Secondary | ICD-10-CM | POA: Diagnosis not present

## 2021-10-30 DIAGNOSIS — I517 Cardiomegaly: Secondary | ICD-10-CM | POA: Diagnosis not present

## 2021-10-30 DIAGNOSIS — R059 Cough, unspecified: Secondary | ICD-10-CM | POA: Diagnosis not present

## 2021-10-30 DIAGNOSIS — I509 Heart failure, unspecified: Secondary | ICD-10-CM | POA: Diagnosis not present

## 2021-11-15 DIAGNOSIS — I517 Cardiomegaly: Secondary | ICD-10-CM | POA: Diagnosis not present

## 2021-11-15 DIAGNOSIS — E039 Hypothyroidism, unspecified: Secondary | ICD-10-CM | POA: Diagnosis not present

## 2021-11-15 DIAGNOSIS — I509 Heart failure, unspecified: Secondary | ICD-10-CM | POA: Diagnosis not present

## 2021-11-15 DIAGNOSIS — I10 Essential (primary) hypertension: Secondary | ICD-10-CM | POA: Diagnosis not present

## 2021-12-14 DIAGNOSIS — I255 Ischemic cardiomyopathy: Secondary | ICD-10-CM | POA: Diagnosis not present

## 2021-12-14 DIAGNOSIS — E782 Mixed hyperlipidemia: Secondary | ICD-10-CM | POA: Diagnosis not present

## 2021-12-14 DIAGNOSIS — I10 Essential (primary) hypertension: Secondary | ICD-10-CM | POA: Diagnosis not present

## 2021-12-14 DIAGNOSIS — I2510 Atherosclerotic heart disease of native coronary artery without angina pectoris: Secondary | ICD-10-CM | POA: Diagnosis not present

## 2022-01-10 DIAGNOSIS — I10 Essential (primary) hypertension: Secondary | ICD-10-CM | POA: Diagnosis not present

## 2022-01-10 DIAGNOSIS — Z0000 Encounter for general adult medical examination without abnormal findings: Secondary | ICD-10-CM | POA: Diagnosis not present

## 2022-01-10 DIAGNOSIS — I517 Cardiomegaly: Secondary | ICD-10-CM | POA: Diagnosis not present

## 2022-01-10 DIAGNOSIS — F331 Major depressive disorder, recurrent, moderate: Secondary | ICD-10-CM | POA: Diagnosis not present

## 2022-01-10 DIAGNOSIS — I509 Heart failure, unspecified: Secondary | ICD-10-CM | POA: Diagnosis not present

## 2022-02-01 DIAGNOSIS — H524 Presbyopia: Secondary | ICD-10-CM | POA: Diagnosis not present

## 2022-02-08 DIAGNOSIS — I509 Heart failure, unspecified: Secondary | ICD-10-CM | POA: Diagnosis not present

## 2022-02-08 DIAGNOSIS — E039 Hypothyroidism, unspecified: Secondary | ICD-10-CM | POA: Diagnosis not present

## 2022-02-08 DIAGNOSIS — I10 Essential (primary) hypertension: Secondary | ICD-10-CM | POA: Diagnosis not present

## 2022-02-08 DIAGNOSIS — I517 Cardiomegaly: Secondary | ICD-10-CM | POA: Diagnosis not present

## 2022-02-15 DIAGNOSIS — I10 Essential (primary) hypertension: Secondary | ICD-10-CM | POA: Diagnosis not present

## 2022-02-15 DIAGNOSIS — H2513 Age-related nuclear cataract, bilateral: Secondary | ICD-10-CM | POA: Diagnosis not present

## 2022-02-15 DIAGNOSIS — H2512 Age-related nuclear cataract, left eye: Secondary | ICD-10-CM | POA: Diagnosis not present

## 2022-02-28 DIAGNOSIS — I252 Old myocardial infarction: Secondary | ICD-10-CM | POA: Diagnosis not present

## 2022-02-28 DIAGNOSIS — F32A Depression, unspecified: Secondary | ICD-10-CM | POA: Diagnosis not present

## 2022-02-28 DIAGNOSIS — I2510 Atherosclerotic heart disease of native coronary artery without angina pectoris: Secondary | ICD-10-CM | POA: Diagnosis not present

## 2022-02-28 DIAGNOSIS — E039 Hypothyroidism, unspecified: Secondary | ICD-10-CM | POA: Diagnosis not present

## 2022-02-28 DIAGNOSIS — Z8616 Personal history of COVID-19: Secondary | ICD-10-CM | POA: Diagnosis not present

## 2022-02-28 DIAGNOSIS — H2513 Age-related nuclear cataract, bilateral: Secondary | ICD-10-CM | POA: Diagnosis not present

## 2022-02-28 DIAGNOSIS — Z79899 Other long term (current) drug therapy: Secondary | ICD-10-CM | POA: Diagnosis not present

## 2022-02-28 DIAGNOSIS — E785 Hyperlipidemia, unspecified: Secondary | ICD-10-CM | POA: Diagnosis not present

## 2022-02-28 DIAGNOSIS — Z955 Presence of coronary angioplasty implant and graft: Secondary | ICD-10-CM | POA: Diagnosis not present

## 2022-02-28 DIAGNOSIS — K219 Gastro-esophageal reflux disease without esophagitis: Secondary | ICD-10-CM | POA: Diagnosis not present

## 2022-02-28 DIAGNOSIS — I255 Ischemic cardiomyopathy: Secondary | ICD-10-CM | POA: Diagnosis not present

## 2022-02-28 DIAGNOSIS — J45909 Unspecified asthma, uncomplicated: Secondary | ICD-10-CM | POA: Diagnosis not present

## 2022-02-28 DIAGNOSIS — Z96653 Presence of artificial knee joint, bilateral: Secondary | ICD-10-CM | POA: Diagnosis not present

## 2022-02-28 DIAGNOSIS — F419 Anxiety disorder, unspecified: Secondary | ICD-10-CM | POA: Diagnosis not present

## 2022-02-28 DIAGNOSIS — H2512 Age-related nuclear cataract, left eye: Secondary | ICD-10-CM | POA: Diagnosis not present

## 2022-02-28 DIAGNOSIS — I130 Hypertensive heart and chronic kidney disease with heart failure and stage 1 through stage 4 chronic kidney disease, or unspecified chronic kidney disease: Secondary | ICD-10-CM | POA: Diagnosis not present

## 2022-02-28 DIAGNOSIS — I10 Essential (primary) hypertension: Secondary | ICD-10-CM | POA: Diagnosis not present

## 2022-02-28 DIAGNOSIS — Z951 Presence of aortocoronary bypass graft: Secondary | ICD-10-CM | POA: Diagnosis not present

## 2022-03-01 DIAGNOSIS — H2512 Age-related nuclear cataract, left eye: Secondary | ICD-10-CM | POA: Diagnosis not present

## 2022-03-01 DIAGNOSIS — H2511 Age-related nuclear cataract, right eye: Secondary | ICD-10-CM | POA: Diagnosis not present

## 2022-03-08 DIAGNOSIS — I517 Cardiomegaly: Secondary | ICD-10-CM | POA: Diagnosis not present

## 2022-03-08 DIAGNOSIS — E039 Hypothyroidism, unspecified: Secondary | ICD-10-CM | POA: Diagnosis not present

## 2022-03-08 DIAGNOSIS — I10 Essential (primary) hypertension: Secondary | ICD-10-CM | POA: Diagnosis not present

## 2022-03-08 DIAGNOSIS — I509 Heart failure, unspecified: Secondary | ICD-10-CM | POA: Diagnosis not present

## 2022-03-21 DIAGNOSIS — Z951 Presence of aortocoronary bypass graft: Secondary | ICD-10-CM | POA: Diagnosis not present

## 2022-03-21 DIAGNOSIS — I82401 Acute embolism and thrombosis of unspecified deep veins of right lower extremity: Secondary | ICD-10-CM | POA: Diagnosis not present

## 2022-03-21 DIAGNOSIS — E669 Obesity, unspecified: Secondary | ICD-10-CM | POA: Diagnosis not present

## 2022-03-21 DIAGNOSIS — F418 Other specified anxiety disorders: Secondary | ICD-10-CM | POA: Diagnosis not present

## 2022-03-21 DIAGNOSIS — Z7982 Long term (current) use of aspirin: Secondary | ICD-10-CM | POA: Diagnosis not present

## 2022-03-21 DIAGNOSIS — N183 Chronic kidney disease, stage 3 unspecified: Secondary | ICD-10-CM | POA: Diagnosis not present

## 2022-03-21 DIAGNOSIS — Z79899 Other long term (current) drug therapy: Secondary | ICD-10-CM | POA: Diagnosis not present

## 2022-03-21 DIAGNOSIS — Z9861 Coronary angioplasty status: Secondary | ICD-10-CM | POA: Diagnosis not present

## 2022-03-21 DIAGNOSIS — I255 Ischemic cardiomyopathy: Secondary | ICD-10-CM | POA: Diagnosis not present

## 2022-03-21 DIAGNOSIS — H2511 Age-related nuclear cataract, right eye: Secondary | ICD-10-CM | POA: Diagnosis not present

## 2022-03-21 DIAGNOSIS — I129 Hypertensive chronic kidney disease with stage 1 through stage 4 chronic kidney disease, or unspecified chronic kidney disease: Secondary | ICD-10-CM | POA: Diagnosis not present

## 2022-03-21 DIAGNOSIS — J45909 Unspecified asthma, uncomplicated: Secondary | ICD-10-CM | POA: Diagnosis not present

## 2022-03-21 DIAGNOSIS — I509 Heart failure, unspecified: Secondary | ICD-10-CM | POA: Diagnosis not present

## 2022-03-21 DIAGNOSIS — I130 Hypertensive heart and chronic kidney disease with heart failure and stage 1 through stage 4 chronic kidney disease, or unspecified chronic kidney disease: Secondary | ICD-10-CM | POA: Diagnosis not present

## 2022-03-21 DIAGNOSIS — E039 Hypothyroidism, unspecified: Secondary | ICD-10-CM | POA: Diagnosis not present

## 2022-03-21 DIAGNOSIS — N189 Chronic kidney disease, unspecified: Secondary | ICD-10-CM | POA: Diagnosis not present

## 2022-03-21 DIAGNOSIS — I252 Old myocardial infarction: Secondary | ICD-10-CM | POA: Diagnosis not present

## 2022-03-21 DIAGNOSIS — I2510 Atherosclerotic heart disease of native coronary artery without angina pectoris: Secondary | ICD-10-CM | POA: Diagnosis not present

## 2022-03-21 DIAGNOSIS — D509 Iron deficiency anemia, unspecified: Secondary | ICD-10-CM | POA: Diagnosis not present

## 2022-03-22 DIAGNOSIS — H2512 Age-related nuclear cataract, left eye: Secondary | ICD-10-CM | POA: Diagnosis not present

## 2022-03-22 DIAGNOSIS — H2511 Age-related nuclear cataract, right eye: Secondary | ICD-10-CM | POA: Diagnosis not present

## 2022-05-09 DIAGNOSIS — I10 Essential (primary) hypertension: Secondary | ICD-10-CM | POA: Diagnosis not present

## 2022-05-09 DIAGNOSIS — R001 Bradycardia, unspecified: Secondary | ICD-10-CM | POA: Diagnosis not present

## 2022-05-09 DIAGNOSIS — H524 Presbyopia: Secondary | ICD-10-CM | POA: Diagnosis not present

## 2022-05-09 DIAGNOSIS — E039 Hypothyroidism, unspecified: Secondary | ICD-10-CM | POA: Diagnosis not present

## 2022-05-09 DIAGNOSIS — I509 Heart failure, unspecified: Secondary | ICD-10-CM | POA: Diagnosis not present

## 2022-05-09 DIAGNOSIS — I517 Cardiomegaly: Secondary | ICD-10-CM | POA: Diagnosis not present

## 2022-06-20 DIAGNOSIS — I10 Essential (primary) hypertension: Secondary | ICD-10-CM | POA: Diagnosis not present

## 2022-06-20 DIAGNOSIS — I420 Dilated cardiomyopathy: Secondary | ICD-10-CM | POA: Diagnosis not present

## 2022-06-20 DIAGNOSIS — I2510 Atherosclerotic heart disease of native coronary artery without angina pectoris: Secondary | ICD-10-CM | POA: Diagnosis not present

## 2022-06-20 DIAGNOSIS — E782 Mixed hyperlipidemia: Secondary | ICD-10-CM | POA: Diagnosis not present

## 2022-07-11 DIAGNOSIS — E039 Hypothyroidism, unspecified: Secondary | ICD-10-CM | POA: Diagnosis not present

## 2022-07-11 DIAGNOSIS — I10 Essential (primary) hypertension: Secondary | ICD-10-CM | POA: Diagnosis not present

## 2022-07-11 DIAGNOSIS — I517 Cardiomegaly: Secondary | ICD-10-CM | POA: Diagnosis not present

## 2022-07-11 DIAGNOSIS — I509 Heart failure, unspecified: Secondary | ICD-10-CM | POA: Diagnosis not present

## 2022-07-17 DIAGNOSIS — E782 Mixed hyperlipidemia: Secondary | ICD-10-CM | POA: Diagnosis not present

## 2022-07-17 DIAGNOSIS — I10 Essential (primary) hypertension: Secondary | ICD-10-CM | POA: Diagnosis not present

## 2022-07-17 DIAGNOSIS — I420 Dilated cardiomyopathy: Secondary | ICD-10-CM | POA: Diagnosis not present

## 2022-07-17 DIAGNOSIS — I2510 Atherosclerotic heart disease of native coronary artery without angina pectoris: Secondary | ICD-10-CM | POA: Diagnosis not present

## 2022-08-29 DIAGNOSIS — I5022 Chronic systolic (congestive) heart failure: Secondary | ICD-10-CM | POA: Diagnosis not present

## 2022-08-29 DIAGNOSIS — I517 Cardiomegaly: Secondary | ICD-10-CM | POA: Diagnosis not present

## 2022-08-29 DIAGNOSIS — I10 Essential (primary) hypertension: Secondary | ICD-10-CM | POA: Diagnosis not present

## 2022-08-29 DIAGNOSIS — I509 Heart failure, unspecified: Secondary | ICD-10-CM | POA: Diagnosis not present

## 2022-08-29 DIAGNOSIS — E039 Hypothyroidism, unspecified: Secondary | ICD-10-CM | POA: Diagnosis not present

## 2022-08-30 DIAGNOSIS — R600 Localized edema: Secondary | ICD-10-CM | POA: Diagnosis not present

## 2022-08-30 DIAGNOSIS — R001 Bradycardia, unspecified: Secondary | ICD-10-CM | POA: Diagnosis not present

## 2022-08-30 DIAGNOSIS — I110 Hypertensive heart disease with heart failure: Secondary | ICD-10-CM | POA: Diagnosis not present

## 2022-08-30 DIAGNOSIS — Z86718 Personal history of other venous thrombosis and embolism: Secondary | ICD-10-CM | POA: Diagnosis not present

## 2022-08-30 DIAGNOSIS — I5022 Chronic systolic (congestive) heart failure: Secondary | ICD-10-CM | POA: Diagnosis not present

## 2022-08-30 DIAGNOSIS — I130 Hypertensive heart and chronic kidney disease with heart failure and stage 1 through stage 4 chronic kidney disease, or unspecified chronic kidney disease: Secondary | ICD-10-CM | POA: Diagnosis not present

## 2022-08-30 DIAGNOSIS — I509 Heart failure, unspecified: Secondary | ICD-10-CM | POA: Diagnosis not present

## 2022-08-30 DIAGNOSIS — I252 Old myocardial infarction: Secondary | ICD-10-CM | POA: Diagnosis not present

## 2022-08-30 DIAGNOSIS — N183 Chronic kidney disease, stage 3 unspecified: Secondary | ICD-10-CM | POA: Diagnosis not present

## 2022-08-31 DIAGNOSIS — I5022 Chronic systolic (congestive) heart failure: Secondary | ICD-10-CM | POA: Diagnosis not present

## 2022-09-04 DIAGNOSIS — I5022 Chronic systolic (congestive) heart failure: Secondary | ICD-10-CM | POA: Diagnosis not present

## 2022-09-05 DIAGNOSIS — I255 Ischemic cardiomyopathy: Secondary | ICD-10-CM | POA: Diagnosis not present

## 2022-09-05 DIAGNOSIS — E782 Mixed hyperlipidemia: Secondary | ICD-10-CM | POA: Diagnosis not present

## 2022-09-05 DIAGNOSIS — I10 Essential (primary) hypertension: Secondary | ICD-10-CM | POA: Diagnosis not present

## 2022-09-05 DIAGNOSIS — I2510 Atherosclerotic heart disease of native coronary artery without angina pectoris: Secondary | ICD-10-CM | POA: Diagnosis not present

## 2022-09-06 DIAGNOSIS — I5022 Chronic systolic (congestive) heart failure: Secondary | ICD-10-CM | POA: Diagnosis not present

## 2022-09-11 DIAGNOSIS — I5022 Chronic systolic (congestive) heart failure: Secondary | ICD-10-CM | POA: Diagnosis not present

## 2022-09-12 DIAGNOSIS — I509 Heart failure, unspecified: Secondary | ICD-10-CM | POA: Diagnosis not present

## 2022-09-12 DIAGNOSIS — I10 Essential (primary) hypertension: Secondary | ICD-10-CM | POA: Diagnosis not present

## 2022-09-12 DIAGNOSIS — E871 Hypo-osmolality and hyponatremia: Secondary | ICD-10-CM | POA: Diagnosis not present

## 2022-09-12 DIAGNOSIS — I517 Cardiomegaly: Secondary | ICD-10-CM | POA: Diagnosis not present

## 2022-09-12 DIAGNOSIS — E039 Hypothyroidism, unspecified: Secondary | ICD-10-CM | POA: Diagnosis not present

## 2022-09-13 DIAGNOSIS — I5022 Chronic systolic (congestive) heart failure: Secondary | ICD-10-CM | POA: Diagnosis not present

## 2022-09-14 DIAGNOSIS — I10 Essential (primary) hypertension: Secondary | ICD-10-CM | POA: Diagnosis not present

## 2022-09-14 DIAGNOSIS — I5022 Chronic systolic (congestive) heart failure: Secondary | ICD-10-CM | POA: Diagnosis not present

## 2022-09-14 DIAGNOSIS — E785 Hyperlipidemia, unspecified: Secondary | ICD-10-CM | POA: Diagnosis not present

## 2022-09-14 DIAGNOSIS — I509 Heart failure, unspecified: Secondary | ICD-10-CM | POA: Diagnosis not present

## 2022-09-19 DIAGNOSIS — E039 Hypothyroidism, unspecified: Secondary | ICD-10-CM | POA: Diagnosis not present

## 2022-09-19 DIAGNOSIS — I509 Heart failure, unspecified: Secondary | ICD-10-CM | POA: Diagnosis not present

## 2022-09-19 DIAGNOSIS — Z9181 History of falling: Secondary | ICD-10-CM | POA: Diagnosis not present

## 2022-09-19 DIAGNOSIS — E785 Hyperlipidemia, unspecified: Secondary | ICD-10-CM | POA: Diagnosis not present

## 2022-09-19 DIAGNOSIS — R54 Age-related physical debility: Secondary | ICD-10-CM | POA: Diagnosis not present

## 2022-09-19 DIAGNOSIS — Z7982 Long term (current) use of aspirin: Secondary | ICD-10-CM | POA: Diagnosis not present

## 2022-09-19 DIAGNOSIS — M545 Low back pain, unspecified: Secondary | ICD-10-CM | POA: Diagnosis not present

## 2022-09-19 DIAGNOSIS — F331 Major depressive disorder, recurrent, moderate: Secondary | ICD-10-CM | POA: Diagnosis not present

## 2022-09-19 DIAGNOSIS — I110 Hypertensive heart disease with heart failure: Secondary | ICD-10-CM | POA: Diagnosis not present

## 2022-09-26 DIAGNOSIS — I110 Hypertensive heart disease with heart failure: Secondary | ICD-10-CM | POA: Diagnosis not present

## 2022-09-26 DIAGNOSIS — E039 Hypothyroidism, unspecified: Secondary | ICD-10-CM | POA: Diagnosis not present

## 2022-09-26 DIAGNOSIS — I509 Heart failure, unspecified: Secondary | ICD-10-CM | POA: Diagnosis not present

## 2022-09-26 DIAGNOSIS — R54 Age-related physical debility: Secondary | ICD-10-CM | POA: Diagnosis not present

## 2022-09-27 DIAGNOSIS — Z7982 Long term (current) use of aspirin: Secondary | ICD-10-CM | POA: Diagnosis not present

## 2022-09-27 DIAGNOSIS — I110 Hypertensive heart disease with heart failure: Secondary | ICD-10-CM | POA: Diagnosis not present

## 2022-09-27 DIAGNOSIS — F331 Major depressive disorder, recurrent, moderate: Secondary | ICD-10-CM | POA: Diagnosis not present

## 2022-09-27 DIAGNOSIS — R54 Age-related physical debility: Secondary | ICD-10-CM | POA: Diagnosis not present

## 2022-09-27 DIAGNOSIS — I509 Heart failure, unspecified: Secondary | ICD-10-CM | POA: Diagnosis not present

## 2022-09-27 DIAGNOSIS — M545 Low back pain, unspecified: Secondary | ICD-10-CM | POA: Diagnosis not present

## 2022-09-27 DIAGNOSIS — E785 Hyperlipidemia, unspecified: Secondary | ICD-10-CM | POA: Diagnosis not present

## 2022-09-27 DIAGNOSIS — E039 Hypothyroidism, unspecified: Secondary | ICD-10-CM | POA: Diagnosis not present

## 2022-09-27 DIAGNOSIS — Z9181 History of falling: Secondary | ICD-10-CM | POA: Diagnosis not present

## 2022-09-29 DIAGNOSIS — M545 Low back pain, unspecified: Secondary | ICD-10-CM | POA: Diagnosis not present

## 2022-09-29 DIAGNOSIS — Z9181 History of falling: Secondary | ICD-10-CM | POA: Diagnosis not present

## 2022-09-29 DIAGNOSIS — I509 Heart failure, unspecified: Secondary | ICD-10-CM | POA: Diagnosis not present

## 2022-09-29 DIAGNOSIS — R54 Age-related physical debility: Secondary | ICD-10-CM | POA: Diagnosis not present

## 2022-09-29 DIAGNOSIS — I110 Hypertensive heart disease with heart failure: Secondary | ICD-10-CM | POA: Diagnosis not present

## 2022-09-29 DIAGNOSIS — Z7982 Long term (current) use of aspirin: Secondary | ICD-10-CM | POA: Diagnosis not present

## 2022-09-29 DIAGNOSIS — E785 Hyperlipidemia, unspecified: Secondary | ICD-10-CM | POA: Diagnosis not present

## 2022-09-29 DIAGNOSIS — E039 Hypothyroidism, unspecified: Secondary | ICD-10-CM | POA: Diagnosis not present

## 2022-09-29 DIAGNOSIS — F331 Major depressive disorder, recurrent, moderate: Secondary | ICD-10-CM | POA: Diagnosis not present

## 2022-10-20 DIAGNOSIS — R0981 Nasal congestion: Secondary | ICD-10-CM | POA: Diagnosis not present

## 2022-10-20 DIAGNOSIS — I110 Hypertensive heart disease with heart failure: Secondary | ICD-10-CM | POA: Diagnosis not present

## 2022-10-20 DIAGNOSIS — R051 Acute cough: Secondary | ICD-10-CM | POA: Diagnosis not present

## 2022-10-20 DIAGNOSIS — R062 Wheezing: Secondary | ICD-10-CM | POA: Diagnosis not present

## 2022-11-14 DIAGNOSIS — I10 Essential (primary) hypertension: Secondary | ICD-10-CM | POA: Diagnosis not present

## 2022-11-14 DIAGNOSIS — I110 Hypertensive heart disease with heart failure: Secondary | ICD-10-CM | POA: Diagnosis not present

## 2022-11-14 DIAGNOSIS — F3342 Major depressive disorder, recurrent, in full remission: Secondary | ICD-10-CM | POA: Diagnosis not present

## 2022-11-14 DIAGNOSIS — I509 Heart failure, unspecified: Secondary | ICD-10-CM | POA: Diagnosis not present

## 2022-11-14 DIAGNOSIS — E039 Hypothyroidism, unspecified: Secondary | ICD-10-CM | POA: Diagnosis not present

## 2022-11-15 DIAGNOSIS — E785 Hyperlipidemia, unspecified: Secondary | ICD-10-CM | POA: Diagnosis not present

## 2022-11-15 DIAGNOSIS — E039 Hypothyroidism, unspecified: Secondary | ICD-10-CM | POA: Diagnosis not present

## 2022-11-15 DIAGNOSIS — I10 Essential (primary) hypertension: Secondary | ICD-10-CM | POA: Diagnosis not present

## 2022-11-24 DIAGNOSIS — I110 Hypertensive heart disease with heart failure: Secondary | ICD-10-CM | POA: Diagnosis not present

## 2022-11-24 DIAGNOSIS — E039 Hypothyroidism, unspecified: Secondary | ICD-10-CM | POA: Diagnosis not present

## 2022-11-24 DIAGNOSIS — I10 Essential (primary) hypertension: Secondary | ICD-10-CM | POA: Diagnosis not present

## 2022-11-24 DIAGNOSIS — R609 Edema, unspecified: Secondary | ICD-10-CM | POA: Diagnosis not present

## 2022-11-24 DIAGNOSIS — R54 Age-related physical debility: Secondary | ICD-10-CM | POA: Diagnosis not present

## 2023-01-01 DIAGNOSIS — Z1211 Encounter for screening for malignant neoplasm of colon: Secondary | ICD-10-CM | POA: Diagnosis not present

## 2023-01-01 DIAGNOSIS — K224 Dyskinesia of esophagus: Secondary | ICD-10-CM | POA: Diagnosis not present

## 2023-01-01 DIAGNOSIS — R072 Precordial pain: Secondary | ICD-10-CM | POA: Diagnosis not present

## 2023-01-13 DIAGNOSIS — I10 Essential (primary) hypertension: Secondary | ICD-10-CM | POA: Diagnosis not present

## 2023-01-13 DIAGNOSIS — E785 Hyperlipidemia, unspecified: Secondary | ICD-10-CM | POA: Diagnosis not present

## 2023-01-13 DIAGNOSIS — E039 Hypothyroidism, unspecified: Secondary | ICD-10-CM | POA: Diagnosis not present

## 2023-02-10 DIAGNOSIS — I110 Hypertensive heart disease with heart failure: Secondary | ICD-10-CM | POA: Diagnosis not present

## 2023-02-10 DIAGNOSIS — E785 Hyperlipidemia, unspecified: Secondary | ICD-10-CM | POA: Diagnosis not present

## 2023-02-10 DIAGNOSIS — I10 Essential (primary) hypertension: Secondary | ICD-10-CM | POA: Diagnosis not present

## 2023-02-23 DIAGNOSIS — Z0000 Encounter for general adult medical examination without abnormal findings: Secondary | ICD-10-CM | POA: Diagnosis not present

## 2023-02-23 DIAGNOSIS — I10 Essential (primary) hypertension: Secondary | ICD-10-CM | POA: Diagnosis not present

## 2023-02-23 DIAGNOSIS — I110 Hypertensive heart disease with heart failure: Secondary | ICD-10-CM | POA: Diagnosis not present

## 2023-02-23 DIAGNOSIS — E039 Hypothyroidism, unspecified: Secondary | ICD-10-CM | POA: Diagnosis not present

## 2023-02-23 DIAGNOSIS — E785 Hyperlipidemia, unspecified: Secondary | ICD-10-CM | POA: Diagnosis not present

## 2023-03-06 DIAGNOSIS — E782 Mixed hyperlipidemia: Secondary | ICD-10-CM | POA: Diagnosis not present

## 2023-03-06 DIAGNOSIS — I420 Dilated cardiomyopathy: Secondary | ICD-10-CM | POA: Diagnosis not present

## 2023-03-06 DIAGNOSIS — I2510 Atherosclerotic heart disease of native coronary artery without angina pectoris: Secondary | ICD-10-CM | POA: Diagnosis not present

## 2023-03-06 DIAGNOSIS — I255 Ischemic cardiomyopathy: Secondary | ICD-10-CM | POA: Diagnosis not present

## 2023-03-06 DIAGNOSIS — I10 Essential (primary) hypertension: Secondary | ICD-10-CM | POA: Diagnosis not present

## 2023-03-14 DIAGNOSIS — E785 Hyperlipidemia, unspecified: Secondary | ICD-10-CM | POA: Diagnosis not present

## 2023-03-14 DIAGNOSIS — I509 Heart failure, unspecified: Secondary | ICD-10-CM | POA: Diagnosis not present

## 2023-03-14 DIAGNOSIS — I10 Essential (primary) hypertension: Secondary | ICD-10-CM | POA: Diagnosis not present

## 2023-04-15 DIAGNOSIS — I10 Essential (primary) hypertension: Secondary | ICD-10-CM | POA: Diagnosis not present

## 2023-04-15 DIAGNOSIS — I509 Heart failure, unspecified: Secondary | ICD-10-CM | POA: Diagnosis not present

## 2023-04-15 DIAGNOSIS — E785 Hyperlipidemia, unspecified: Secondary | ICD-10-CM | POA: Diagnosis not present

## 2023-04-23 DIAGNOSIS — I2510 Atherosclerotic heart disease of native coronary artery without angina pectoris: Secondary | ICD-10-CM | POA: Diagnosis not present

## 2023-04-23 DIAGNOSIS — Z1330 Encounter for screening examination for mental health and behavioral disorders, unspecified: Secondary | ICD-10-CM | POA: Diagnosis not present

## 2023-04-23 DIAGNOSIS — I5022 Chronic systolic (congestive) heart failure: Secondary | ICD-10-CM | POA: Diagnosis not present

## 2023-04-23 DIAGNOSIS — I10 Essential (primary) hypertension: Secondary | ICD-10-CM | POA: Diagnosis not present

## 2023-04-26 DIAGNOSIS — R54 Age-related physical debility: Secondary | ICD-10-CM | POA: Diagnosis not present

## 2023-04-26 DIAGNOSIS — I509 Heart failure, unspecified: Secondary | ICD-10-CM | POA: Diagnosis not present

## 2023-04-26 DIAGNOSIS — I10 Essential (primary) hypertension: Secondary | ICD-10-CM | POA: Diagnosis not present

## 2023-04-26 DIAGNOSIS — E039 Hypothyroidism, unspecified: Secondary | ICD-10-CM | POA: Diagnosis not present

## 2023-04-27 DIAGNOSIS — Y92002 Bathroom of unspecified non-institutional (private) residence single-family (private) house as the place of occurrence of the external cause: Secondary | ICD-10-CM | POA: Diagnosis not present

## 2023-04-27 DIAGNOSIS — M85871 Other specified disorders of bone density and structure, right ankle and foot: Secondary | ICD-10-CM | POA: Diagnosis not present

## 2023-04-27 DIAGNOSIS — M79671 Pain in right foot: Secondary | ICD-10-CM | POA: Diagnosis not present

## 2023-04-27 DIAGNOSIS — M25571 Pain in right ankle and joints of right foot: Secondary | ICD-10-CM | POA: Diagnosis not present

## 2023-04-27 DIAGNOSIS — Z9181 History of falling: Secondary | ICD-10-CM | POA: Diagnosis not present

## 2023-04-27 DIAGNOSIS — R296 Repeated falls: Secondary | ICD-10-CM | POA: Diagnosis not present

## 2023-04-27 DIAGNOSIS — Z7982 Long term (current) use of aspirin: Secondary | ICD-10-CM | POA: Diagnosis not present

## 2023-04-27 DIAGNOSIS — S93601A Unspecified sprain of right foot, initial encounter: Secondary | ICD-10-CM | POA: Diagnosis not present

## 2023-04-27 DIAGNOSIS — W1830XA Fall on same level, unspecified, initial encounter: Secondary | ICD-10-CM | POA: Diagnosis not present

## 2023-04-27 DIAGNOSIS — M7989 Other specified soft tissue disorders: Secondary | ICD-10-CM | POA: Diagnosis not present

## 2023-04-27 DIAGNOSIS — I10 Essential (primary) hypertension: Secondary | ICD-10-CM | POA: Diagnosis not present

## 2023-05-15 DIAGNOSIS — I10 Essential (primary) hypertension: Secondary | ICD-10-CM | POA: Diagnosis not present

## 2023-05-15 DIAGNOSIS — E785 Hyperlipidemia, unspecified: Secondary | ICD-10-CM | POA: Diagnosis not present

## 2023-05-15 DIAGNOSIS — I509 Heart failure, unspecified: Secondary | ICD-10-CM | POA: Diagnosis not present

## 2023-06-08 DIAGNOSIS — R0789 Other chest pain: Secondary | ICD-10-CM | POA: Diagnosis not present

## 2023-06-08 DIAGNOSIS — Z1211 Encounter for screening for malignant neoplasm of colon: Secondary | ICD-10-CM | POA: Diagnosis not present

## 2023-06-08 DIAGNOSIS — D125 Benign neoplasm of sigmoid colon: Secondary | ICD-10-CM | POA: Diagnosis not present

## 2023-06-16 DIAGNOSIS — I10 Essential (primary) hypertension: Secondary | ICD-10-CM | POA: Diagnosis not present

## 2023-06-16 DIAGNOSIS — E785 Hyperlipidemia, unspecified: Secondary | ICD-10-CM | POA: Diagnosis not present

## 2023-06-16 DIAGNOSIS — I509 Heart failure, unspecified: Secondary | ICD-10-CM | POA: Diagnosis not present

## 2023-06-28 DIAGNOSIS — I509 Heart failure, unspecified: Secondary | ICD-10-CM | POA: Diagnosis not present

## 2023-06-28 DIAGNOSIS — R54 Age-related physical debility: Secondary | ICD-10-CM | POA: Diagnosis not present

## 2023-06-28 DIAGNOSIS — I10 Essential (primary) hypertension: Secondary | ICD-10-CM | POA: Diagnosis not present

## 2023-06-28 DIAGNOSIS — E039 Hypothyroidism, unspecified: Secondary | ICD-10-CM | POA: Diagnosis not present

## 2023-07-15 DIAGNOSIS — I110 Hypertensive heart disease with heart failure: Secondary | ICD-10-CM | POA: Diagnosis not present

## 2023-07-15 DIAGNOSIS — I10 Essential (primary) hypertension: Secondary | ICD-10-CM | POA: Diagnosis not present

## 2023-07-15 DIAGNOSIS — E785 Hyperlipidemia, unspecified: Secondary | ICD-10-CM | POA: Diagnosis not present

## 2023-07-15 DIAGNOSIS — I509 Heart failure, unspecified: Secondary | ICD-10-CM | POA: Diagnosis not present

## 2023-07-26 DIAGNOSIS — I2510 Atherosclerotic heart disease of native coronary artery without angina pectoris: Secondary | ICD-10-CM | POA: Diagnosis not present

## 2023-07-26 DIAGNOSIS — J4520 Mild intermittent asthma, uncomplicated: Secondary | ICD-10-CM | POA: Diagnosis not present

## 2023-07-26 DIAGNOSIS — I5022 Chronic systolic (congestive) heart failure: Secondary | ICD-10-CM | POA: Diagnosis not present

## 2023-07-26 DIAGNOSIS — K219 Gastro-esophageal reflux disease without esophagitis: Secondary | ICD-10-CM | POA: Diagnosis not present

## 2023-08-13 DIAGNOSIS — I509 Heart failure, unspecified: Secondary | ICD-10-CM | POA: Diagnosis not present

## 2023-08-13 DIAGNOSIS — E785 Hyperlipidemia, unspecified: Secondary | ICD-10-CM | POA: Diagnosis not present

## 2023-08-13 DIAGNOSIS — I10 Essential (primary) hypertension: Secondary | ICD-10-CM | POA: Diagnosis not present

## 2023-08-28 DIAGNOSIS — I10 Essential (primary) hypertension: Secondary | ICD-10-CM | POA: Diagnosis not present

## 2023-08-28 DIAGNOSIS — F3342 Major depressive disorder, recurrent, in full remission: Secondary | ICD-10-CM | POA: Diagnosis not present

## 2023-08-28 DIAGNOSIS — I509 Heart failure, unspecified: Secondary | ICD-10-CM | POA: Diagnosis not present

## 2023-08-28 DIAGNOSIS — E039 Hypothyroidism, unspecified: Secondary | ICD-10-CM | POA: Diagnosis not present

## 2023-08-28 DIAGNOSIS — E785 Hyperlipidemia, unspecified: Secondary | ICD-10-CM | POA: Diagnosis not present

## 2023-08-28 DIAGNOSIS — N1832 Chronic kidney disease, stage 3b: Secondary | ICD-10-CM | POA: Diagnosis not present

## 2023-09-12 DIAGNOSIS — I10 Essential (primary) hypertension: Secondary | ICD-10-CM | POA: Diagnosis not present

## 2023-09-12 DIAGNOSIS — I255 Ischemic cardiomyopathy: Secondary | ICD-10-CM | POA: Diagnosis not present

## 2023-09-12 DIAGNOSIS — E782 Mixed hyperlipidemia: Secondary | ICD-10-CM | POA: Diagnosis not present

## 2023-09-12 DIAGNOSIS — I2510 Atherosclerotic heart disease of native coronary artery without angina pectoris: Secondary | ICD-10-CM | POA: Diagnosis not present

## 2023-09-15 DIAGNOSIS — I10 Essential (primary) hypertension: Secondary | ICD-10-CM | POA: Diagnosis not present

## 2023-09-15 DIAGNOSIS — E785 Hyperlipidemia, unspecified: Secondary | ICD-10-CM | POA: Diagnosis not present

## 2023-09-15 DIAGNOSIS — I509 Heart failure, unspecified: Secondary | ICD-10-CM | POA: Diagnosis not present

## 2023-10-14 DIAGNOSIS — I10 Essential (primary) hypertension: Secondary | ICD-10-CM | POA: Diagnosis not present

## 2023-10-14 DIAGNOSIS — E785 Hyperlipidemia, unspecified: Secondary | ICD-10-CM | POA: Diagnosis not present

## 2023-10-14 DIAGNOSIS — I509 Heart failure, unspecified: Secondary | ICD-10-CM | POA: Diagnosis not present
